# Patient Record
Sex: Female | Born: 1935 | Race: White | Hispanic: No | Marital: Married | Smoking: Never smoker
Health system: Southern US, Community
[De-identification: ages and names within clinical notes are randomized; demographics above are authoritative.]

## PROBLEM LIST (undated history)

## (undated) DIAGNOSIS — I10 Essential (primary) hypertension: Secondary | ICD-10-CM

## (undated) DIAGNOSIS — I4891 Unspecified atrial fibrillation: Secondary | ICD-10-CM

## (undated) DIAGNOSIS — E785 Hyperlipidemia, unspecified: Secondary | ICD-10-CM

## (undated) HISTORY — PX: CARDIAC ELECTROPHYSIOLOGY MAPPING AND ABLATION: SHX1292

## (undated) HISTORY — PX: ABDOMINAL HYSTERECTOMY: SUR658

---

## 2005-01-01 ENCOUNTER — Ambulatory Visit: Payer: Self-pay | Admitting: Internal Medicine

## 2006-01-17 ENCOUNTER — Emergency Department: Payer: Self-pay | Admitting: Emergency Medicine

## 2006-03-30 ENCOUNTER — Ambulatory Visit: Payer: Self-pay | Admitting: Internal Medicine

## 2006-10-28 ENCOUNTER — Ambulatory Visit: Payer: Self-pay | Admitting: Internal Medicine

## 2007-06-14 ENCOUNTER — Ambulatory Visit: Payer: Self-pay | Admitting: Internal Medicine

## 2008-06-14 ENCOUNTER — Ambulatory Visit: Payer: Self-pay | Admitting: Internal Medicine

## 2009-06-17 ENCOUNTER — Ambulatory Visit: Payer: Self-pay | Admitting: Internal Medicine

## 2009-07-23 ENCOUNTER — Ambulatory Visit: Payer: Self-pay | Admitting: Internal Medicine

## 2011-07-17 ENCOUNTER — Ambulatory Visit: Payer: Self-pay | Admitting: Internal Medicine

## 2012-08-15 ENCOUNTER — Ambulatory Visit: Payer: Self-pay | Admitting: Internal Medicine

## 2013-06-18 ENCOUNTER — Emergency Department: Payer: Self-pay | Admitting: Internal Medicine

## 2013-08-29 ENCOUNTER — Ambulatory Visit: Payer: Self-pay | Admitting: Internal Medicine

## 2014-12-21 ENCOUNTER — Other Ambulatory Visit: Payer: Self-pay | Admitting: Internal Medicine

## 2014-12-21 DIAGNOSIS — Z1231 Encounter for screening mammogram for malignant neoplasm of breast: Secondary | ICD-10-CM

## 2014-12-28 ENCOUNTER — Ambulatory Visit: Payer: Self-pay

## 2015-01-03 ENCOUNTER — Ambulatory Visit: Payer: Self-pay | Attending: Internal Medicine

## 2018-07-30 ENCOUNTER — Inpatient Hospital Stay: Payer: Medicare Other

## 2018-07-30 ENCOUNTER — Other Ambulatory Visit: Payer: Self-pay

## 2018-07-30 ENCOUNTER — Inpatient Hospital Stay
Admission: EM | Admit: 2018-07-30 | Discharge: 2018-08-01 | DRG: 186 | Disposition: A | Discharge status: Home / Self Care | Payer: Medicare Other | Attending: Internal Medicine | Admitting: Internal Medicine

## 2018-07-30 ENCOUNTER — Emergency Department: Payer: Medicare Other

## 2018-07-30 ENCOUNTER — Encounter: Payer: Self-pay | Admitting: Emergency Medicine

## 2018-07-30 DIAGNOSIS — Z800 Family history of malignant neoplasm of digestive organs: Secondary | ICD-10-CM

## 2018-07-30 DIAGNOSIS — Z90710 Acquired absence of both cervix and uterus: Secondary | ICD-10-CM | POA: Diagnosis not present

## 2018-07-30 DIAGNOSIS — Z7982 Long term (current) use of aspirin: Secondary | ICD-10-CM | POA: Diagnosis not present

## 2018-07-30 DIAGNOSIS — Z20828 Contact with and (suspected) exposure to other viral communicable diseases: Secondary | ICD-10-CM | POA: Diagnosis present

## 2018-07-30 DIAGNOSIS — F410 Panic disorder [episodic paroxysmal anxiety] without agoraphobia: Secondary | ICD-10-CM | POA: Diagnosis present

## 2018-07-30 DIAGNOSIS — Z79899 Other long term (current) drug therapy: Secondary | ICD-10-CM

## 2018-07-30 DIAGNOSIS — K7460 Unspecified cirrhosis of liver: Secondary | ICD-10-CM

## 2018-07-30 DIAGNOSIS — E663 Overweight: Secondary | ICD-10-CM | POA: Diagnosis present

## 2018-07-30 DIAGNOSIS — I482 Chronic atrial fibrillation, unspecified: Secondary | ICD-10-CM | POA: Diagnosis present

## 2018-07-30 DIAGNOSIS — Z880 Allergy status to penicillin: Secondary | ICD-10-CM | POA: Diagnosis not present

## 2018-07-30 DIAGNOSIS — Z66 Do not resuscitate: Secondary | ICD-10-CM | POA: Diagnosis present

## 2018-07-30 DIAGNOSIS — E785 Hyperlipidemia, unspecified: Secondary | ICD-10-CM | POA: Diagnosis present

## 2018-07-30 DIAGNOSIS — Z882 Allergy status to sulfonamides status: Secondary | ICD-10-CM

## 2018-07-30 DIAGNOSIS — J189 Pneumonia, unspecified organism: Secondary | ICD-10-CM | POA: Diagnosis present

## 2018-07-30 DIAGNOSIS — J90 Pleural effusion, not elsewhere classified: Secondary | ICD-10-CM | POA: Diagnosis present

## 2018-07-30 DIAGNOSIS — I080 Rheumatic disorders of both mitral and aortic valves: Secondary | ICD-10-CM | POA: Diagnosis present

## 2018-07-30 DIAGNOSIS — Z8249 Family history of ischemic heart disease and other diseases of the circulatory system: Secondary | ICD-10-CM | POA: Diagnosis not present

## 2018-07-30 DIAGNOSIS — J9601 Acute respiratory failure with hypoxia: Secondary | ICD-10-CM

## 2018-07-30 DIAGNOSIS — I10 Essential (primary) hypertension: Secondary | ICD-10-CM | POA: Diagnosis present

## 2018-07-30 DIAGNOSIS — R0602 Shortness of breath: Secondary | ICD-10-CM

## 2018-07-30 DIAGNOSIS — Z6827 Body mass index (BMI) 27.0-27.9, adult: Secondary | ICD-10-CM

## 2018-07-30 HISTORY — DX: Essential (primary) hypertension: I10

## 2018-07-30 HISTORY — DX: Unspecified atrial fibrillation: I48.91

## 2018-07-30 HISTORY — DX: Hyperlipidemia, unspecified: E78.5

## 2018-07-30 LAB — BASIC METABOLIC PANEL
Anion gap: 7 (ref 5–15)
BUN: 17 mg/dL (ref 8–23)
CO2: 24 mmol/L (ref 22–32)
Calcium: 8.7 mg/dL — ABNORMAL LOW (ref 8.9–10.3)
Chloride: 105 mmol/L (ref 98–111)
Creatinine, Ser: 0.75 mg/dL (ref 0.44–1.00)
GFR calc Af Amer: >60 mL/min (ref >60)
GFR calc non Af Amer: >60 mL/min (ref >60)
Glucose, Bld: 202 mg/dL — ABNORMAL HIGH (ref 70–99)
Potassium: 4.4 mmol/L (ref 3.5–5.1)
Sodium: 136 mmol/L (ref 135–145)

## 2018-07-30 LAB — SARS CORONAVIRUS 2 BY RT PCR (HOSPITAL ORDER, PERFORMED IN ~~LOC~~ HOSPITAL LAB): SARS Coronavirus 2: NEGATIVE

## 2018-07-30 LAB — CBC
HCT: 41.2 % (ref 36.0–46.0)
Hemoglobin: 13.3 g/dL (ref 12.0–15.0)
MCH: 27.5 pg (ref 26.0–34.0)
MCHC: 32.3 g/dL (ref 30.0–36.0)
MCV: 85.3 fL (ref 80.0–100.0)
Platelets: 263 10*3/uL (ref 150–400)
RBC: 4.83 MIL/uL (ref 3.87–5.11)
RDW: 13.3 % (ref 11.5–15.5)
WBC: 12.6 10*3/uL — ABNORMAL HIGH (ref 4.0–10.5)
nRBC: 0 % (ref 0.0–0.2)

## 2018-07-30 LAB — LACTATE DEHYDROGENASE, PLEURAL OR PERITONEAL FLUID: LD, Fluid: 106 U/L — ABNORMAL HIGH (ref 3–23)

## 2018-07-30 LAB — BODY FLUID CELL COUNT WITH DIFFERENTIAL
Eos, Fluid: 0 %
Lymphs, Fluid: 86 %
Monocyte-Macrophage-Serous Fluid: 8 %
Neutrophil Count, Fluid: 6 %
Other Cells, Fluid: 0 %
Total Nucleated Cell Count, Fluid: 3290 cu mm

## 2018-07-30 LAB — HEMOGLOBIN A1C
Hgb A1c MFr Bld: 6.9 % — ABNORMAL HIGH (ref 4.8–5.6)
Mean Plasma Glucose: 151.33 mg/dL

## 2018-07-30 LAB — PROTEIN, PLEURAL OR PERITONEAL FLUID: Total protein, fluid: 3.4 g/dL

## 2018-07-30 LAB — GLUCOSE, PLEURAL OR PERITONEAL FLUID: Glucose, Fluid: 170 mg/dL

## 2018-07-30 LAB — PROTIME-INR
INR: 1.1 (ref 0.8–1.2)
Prothrombin Time: 14 seconds (ref 11.4–15.2)

## 2018-07-30 LAB — LACTIC ACID, PLASMA: Lactic Acid, Venous: 1 mmol/L (ref 0.5–1.9)

## 2018-07-30 LAB — BRAIN NATRIURETIC PEPTIDE: B Natriuretic Peptide: 167 pg/mL — ABNORMAL HIGH (ref 0.0–100.0)

## 2018-07-30 LAB — TSH: TSH: 2.141 u[IU]/mL (ref 0.350–4.500)

## 2018-07-30 LAB — TROPONIN I: Troponin I: <0.03 ng/mL (ref <0.03)

## 2018-07-30 MED ORDER — IOHEXOL 350 MG/ML SOLN
75.0000 mL | Freq: Once | INTRAVENOUS | Status: AC | PRN
  Administered 2018-07-30: 03:00:00 75 mL via INTRAVENOUS

## 2018-07-30 MED ORDER — METOPROLOL TARTRATE 50 MG PO TABS
50.0000 mg | ORAL_TABLET | Freq: Two times a day (BID) | ORAL | Status: DC
  Administered 2018-07-30 – 2018-08-01 (×5): 50 mg via ORAL
  Filled 2018-07-30 (×5): qty 1

## 2018-07-30 MED ORDER — ACETAMINOPHEN 325 MG PO TABS
650.0000 mg | ORAL_TABLET | Freq: Four times a day (QID) | ORAL | Status: DC | PRN
  Administered 2018-07-31: 06:00:00 650 mg via ORAL
  Filled 2018-07-30: qty 2

## 2018-07-30 MED ORDER — ASPIRIN EC 81 MG PO TBEC
81.0000 mg | DELAYED_RELEASE_TABLET | Freq: Every day | ORAL | Status: DC
  Administered 2018-07-30 – 2018-08-01 (×3): 81 mg via ORAL
  Filled 2018-07-30 (×3): qty 1

## 2018-07-30 MED ORDER — ONDANSETRON HCL 4 MG PO TABS: 4.0000 mg | ORAL_TABLET | Freq: Four times a day (QID) | ORAL | Status: DC | PRN

## 2018-07-30 MED ORDER — ONDANSETRON HCL 4 MG/2ML IJ SOLN: 4.0000 mg | Freq: Four times a day (QID) | INTRAMUSCULAR | Status: DC | PRN

## 2018-07-30 MED ORDER — LEVOFLOXACIN IN D5W 750 MG/150ML IV SOLN
750.0000 mg | Freq: Once | INTRAVENOUS | Status: AC
  Administered 2018-07-30: 02:00:00 750 mg via INTRAVENOUS
  Filled 2018-07-30: qty 150

## 2018-07-30 MED ORDER — DOCUSATE SODIUM 100 MG PO CAPS
100.0000 mg | ORAL_CAPSULE | Freq: Two times a day (BID) | ORAL | Status: DC
  Administered 2018-07-30 – 2018-08-01 (×5): 100 mg via ORAL
  Filled 2018-07-30 (×5): qty 1

## 2018-07-30 MED ORDER — SODIUM CHLORIDE 0.9 % IV SOLN
INTRAVENOUS | Status: DC
  Administered 2018-07-30: 10:00:00 via INTRAVENOUS

## 2018-07-30 MED ORDER — LORAZEPAM BOLUS VIA INFUSION: 0.5000 mg | Freq: Once | INTRAVENOUS | Status: DC

## 2018-07-30 MED ORDER — ADULT MULTIVITAMIN W/MINERALS CH
1.0000 | ORAL_TABLET | Freq: Every day | ORAL | Status: DC
  Administered 2018-07-30 – 2018-08-01 (×3): 1 via ORAL
  Filled 2018-07-30 (×3): qty 1

## 2018-07-30 MED ORDER — LORAZEPAM 2 MG/ML IJ SOLN
0.5000 mg | Freq: Once | INTRAMUSCULAR | Status: AC
  Administered 2018-07-30: 0.5 mg via INTRAVENOUS
  Filled 2018-07-30: qty 1

## 2018-07-30 MED ORDER — SODIUM CHLORIDE 0.9 % IV SOLN
500.0000 mg | INTRAVENOUS | Status: DC
  Filled 2018-07-30: qty 500

## 2018-07-30 MED ORDER — ENOXAPARIN SODIUM 40 MG/0.4ML ~~LOC~~ SOLN
40.0000 mg | SUBCUTANEOUS | Status: DC
  Administered 2018-07-30: 10:00:00 40 mg via SUBCUTANEOUS
  Filled 2018-07-30: qty 0.4

## 2018-07-30 MED ORDER — SODIUM CHLORIDE 0.9 % IV SOLN: 2.0000 g | INTRAVENOUS | Status: DC

## 2018-07-30 MED ORDER — SODIUM CHLORIDE 0.9 % IV SOLN
500.0000 mg | INTRAVENOUS | Status: DC
  Administered 2018-07-31 – 2018-08-01 (×2): 500 mg via INTRAVENOUS
  Filled 2018-07-30 (×3): qty 500

## 2018-07-30 MED ORDER — SODIUM CHLORIDE 0.9 % IV SOLN
1.0000 g | INTRAVENOUS | Status: DC
  Administered 2018-07-30 – 2018-08-01 (×3): 1 g via INTRAVENOUS
  Filled 2018-07-30 (×2): qty 1
  Filled 2018-07-30: qty 10
  Filled 2018-07-30: qty 1

## 2018-07-30 MED ORDER — PRAVASTATIN SODIUM 20 MG PO TABS
20.0000 mg | ORAL_TABLET | Freq: Every day | ORAL | Status: DC
  Administered 2018-07-30 – 2018-07-31 (×2): 20 mg via ORAL
  Filled 2018-07-30 (×2): qty 1

## 2018-07-30 MED ORDER — ACETAMINOPHEN 650 MG RE SUPP: 650.0000 mg | Freq: Four times a day (QID) | RECTAL | Status: DC | PRN

## 2018-07-30 NOTE — Procedures (Addendum)
PROCEDURE NOTE: THORACENTESIS    Date: 07/30/2018,   Loc:215A/215A-AA MRN# [id number] Lisa Moon   Indication: Pleural effusion/diagnsyic/therapeutic/ Empyema  A time-out was completed verifying correct patient, procedure, site, positioning, and implant(s) or special equipment if applicable.  Ultrasound guidance was used and appropriate fluid pocket was identified and marked.   Patient was positioned, prepped and draped in usual sterile fashion.  <_1____>% Lidocaine was used to anesthetize the area.  A 7 French catheter was introduced into the pleural space and fluid was removed. Blood loss was <none>  A chest xray was ordered to evaluate for pneumothorax.  Total Fluid Removed: <_1251ml___>  Color of Fluid: <_yellow straw colored ___>  Sent for: Cell Count Gram Stain Cultures LDH Glucose pH Cytology  Patient tolerated the procedure <well> and there were <no> complications.     Vida Rigger, M.D.  Pulmonary & Critical Care Medicine  [facility]

## 2018-07-30 NOTE — ED Notes (Addendum)
ED TO INPATIENT HANDOFF REPORT  ED Nurse Name and Phone #:  Darcey Nora RN [phone]  S Name/Age/Gender Lisa Moon 85 y.o. female Room/Bed: ED04A/ED04A  Code Status   Code Status: Not on file  Home/SNF/Other Home Patient oriented to: self, place, time and situation Is this baseline? Yes   Triage Complete: Triage complete  Chief Complaint Difficulty breathing  Triage Note Pt arrived via EMS from home with c/o increased chest pain and SOB x3 days, with tonight becoming unbearable while sleeping. Pt sts, "I just couldn't breath." Pt received 4 Asprin, 2 sublingual Nitro and 4mg  zofran in route, per EMS. Pt in Afib, with known hx. Pt 94% on RA on arrival.     Allergies Allergies  Allergen Reactions  . Sulfa Antibiotics   . Penicillins Rash    Level of Care/Admitting Diagnosis ED Disposition    ED Disposition Condition Comment   Admit  Hospital Area: [facility] [[id number]]  Level of Care: Med-Surg [16]  Covid Evaluation: Screening Protocol (No Symptoms)  Diagnosis: CAP (community acquired pneumonia) [919166]  Admitting Physician: Arnaldo Natal [[id number]]  Attending Physician: Arnaldo Natal [[id number]]  Estimated length of stay: past midnight tomorrow  Certification:: I certify this patient will need inpatient services for at least 2 midnights  PT Class (Do Not Modify): Inpatient [101]  PT Acc Code (Do Not Modify): Private [1]       B Medical/Surgery History Past Medical History:  Diagnosis Date  . A-fib (HCC)      A IV Location/Drains/Wounds Patient Lines/Drains/Airways Status   Active Line/Drains/Airways    Name:   Placement date:   Placement time:   Site:   Days:   Peripheral IV 07/30/18 Left Forearm   07/30/18    0040    Forearm   less than 1   Peripheral IV 07/30/18 Left Hand   07/30/18    0030    Hand   less than 1          Intake/Output Last 24 hours  Intake/Output Summary (Last 24 hours) at 07/30/2018 3953 Last  data filed at 07/30/2018 0411 Gross per 24 hour  Intake 150 ml  Output -  Net 150 ml    Labs/Imaging Results for orders placed or performed during the hospital encounter of 07/30/18 (from the past 48 hour(s))  Basic metabolic panel     Status: Abnormal   Collection Time: 07/30/18 12:38 AM  Result Value Ref Range   Sodium 136 135 - 145 mmol/L   Potassium 4.4 3.5 - 5.1 mmol/L   Chloride 105 98 - 111 mmol/L   CO2 24 22 - 32 mmol/L   Glucose, Bld 202 (H) 70 - 99 mg/dL   BUN 17 8 - 23 mg/dL   Creatinine, Ser 2.02 0.44 - 1.00 mg/dL   Calcium 8.7 (L) 8.9 - 10.3 mg/dL   GFR calc non Af Amer >60 >60 mL/min   GFR calc Af Amer >60 >60 mL/min   Anion gap 7 5 - 15    Comment: Performed at [facility], [address] [place], [place] [address]  CBC     Status: Abnormal   Collection Time: 07/30/18 12:38 AM  Result Value Ref Range   WBC 12.6 (H) 4.0 - 10.5 K/uL   RBC 4.83 3.87 - 5.11 MIL/uL   Hemoglobin 13.3 12.0 - 15.0 g/dL   HCT 68.6 16.8 - 37.2 %   MCV 85.3 80.0 - 100.0 fL   MCH 27.5 26.0 -  34.0 pg   MCHC 32.3 30.0 - 36.0 g/dL   RDW 16.113.3 09.611.5 - 04.515.5 %   Platelets 263 150 - 400 K/uL   nRBC 0.0 0.0 - 0.2 %    Comment: Performed at [facility], [address] [place], [place] [address]  Troponin I - ONCE - STAT     Status: None   Collection Time: 07/30/18 12:38 AM  Result Value Ref Range   Troponin I <0.03 <0.03 ng/mL    Comment: Performed at [facility], [address] [place], [place] [address]  Brain natriuretic peptide     Status: Abnormal   Collection Time: 07/30/18 12:38 AM  Result Value Ref Range   B Natriuretic Peptide 167.0 (H) 0.0 - 100.0 pg/mL    Comment: Performed at [facility], [address] [place], [place] [address]  SARS Coronavirus 2 (CEPHEID- Performed in [facility] hospital lab), Hosp Order     Status: None   Collection Time: 07/30/18  1:33 AM  Result Value Ref Range   SARS Coronavirus 2 NEGATIVE NEGATIVE     Comment: (NOTE) If result is NEGATIVE SARS-CoV-2 target nucleic acids are NOT DETECTED. The SARS-CoV-2 RNA is generally detectable in upper and lower  respiratory specimens during the acute phase of infection. The lowest  concentration of SARS-CoV-2 viral copies this assay can detect is 250  copies / mL. A negative result does not preclude SARS-CoV-2 infection  and should not be used as the sole basis for treatment or other  patient management decisions.  A negative result may occur with  improper specimen collection / handling, submission of specimen other  than nasopharyngeal swab, presence of viral mutation(s) within the  areas targeted by this assay, and inadequate number of viral copies  (<250 copies / mL). A negative result must be combined with clinical  observations, patient history, and epidemiological information. If result is POSITIVE SARS-CoV-2 target nucleic acids are DETECTED. The SARS-CoV-2 RNA is generally detectable in upper and lower  respiratory specimens dur ing the acute phase of infection.  Positive  results are indicative of active infection with SARS-CoV-2.  Clinical  correlation with patient history and other diagnostic information is  necessary to determine patient infection status.  Positive results do  not rule out bacterial infection or co-infection with other viruses. If result is PRESUMPTIVE POSTIVE SARS-CoV-2 nucleic acids MAY BE PRESENT.   A presumptive positive result was obtained on the submitted specimen  and confirmed on repeat testing.  While 2019 novel coronavirus  (SARS-CoV-2) nucleic acids may be present in the submitted sample  additional confirmatory testing may be necessary for epidemiological  and / or clinical management purposes  to differentiate between  SARS-CoV-2 and other Sarbecovirus currently known to infect humans.  If clinically indicated additional testing with an alternate test  methodology [id number]) is advised. The SARS-CoV-2  RNA is generally  detectable in upper and lower respiratory sp ecimens during the acute  phase of infection. The expected result is Negative. Fact Sheet for Patients:  BoilerBrush.com.cyhttps://www.fda.gov/media/136312/download Fact Sheet for Healthcare Providers: https://pope.com/https://www.fda.gov/media/136313/download This test is not yet approved or cleared by the [place] FDA and has been authorized for detection and/or diagnosis of SARS-CoV-2 by FDA under an Emergency Use Authorization (EUA).  This EUA will remain in effect (meaning this test can be used) for the duration of the COVID-19 declaration under Section 564(b)(1) of the Act, 21 U.S.C. section 360bbb-3(b)(1), unless the authorization is terminated or revoked sooner. Performed at  [facility], [address], [place], [place] [address]   Lactic acid, plasma     Status: None   Collection Time: 07/30/18  2:13 AM  Result Value Ref Range   Lactic Acid, Venous 1.0 0.5 - 1.9 mmol/L    Comment: Performed at [facility], [address] [place], [place] [address]   Ct Angio Chest Pe W And/or Wo Contrast  Result Date: 07/30/2018 CLINICAL DATA:  Chest pain, shortness of breath x3 days EXAM: CT ANGIOGRAPHY CHEST WITH CONTRAST TECHNIQUE: Multidetector CT imaging of the chest was performed using the standard protocol during bolus administration of intravenous contrast. Multiplanar CT image reconstructions and MIPs were obtained to evaluate the vascular anatomy. CONTRAST:  75mL OMNIPAQUE IOHEXOL 350 MG/ML SOLN COMPARISON:  Chest radiograph dated 07/30/2018 FINDINGS: Cardiovascular: Satisfactory opacification of the bilateral pulmonary arteries to the segmental level. No evidence of pulmonary embolism. Heart is normal in size.  No pericardial effusion. Mild atherosclerotic calcifications of the aortic arch. Enlargement of the main pulmonary artery, suggesting pulmonary arterial hypertension. Three vessel coronary atherosclerosis. Mediastinum/Nodes:  Suspected 9 mm short axis subcarinal node (series 5/image 39), within normal limits. No suspicious mediastinal lymphadenopathy. Visualized thyroid is unremarkable. Lungs/Pleura: Large right pleural effusion. Associated right upper, middle, and lower lobe compressive atelectasis. Mild interlobular septal thickening in the right upper lobe may reflect asymmetric edema (series 7/image 24), possibly related to lung re-expansion in the setting of the effusion. Small left pleural effusion.  Mild left basilar atelectasis. No suspicious pulmonary nodules, noting poor visualization of the right lung. No pneumothorax. Upper Abdomen: Nodular hepatic contour, raising the possibility of cirrhosis. Calcified hepatic and splenic granulomata. Vascular calcifications. Musculoskeletal: Mild degenerative changes of the visualized thoracolumbar spine. Review of the MIP images confirms the above findings. IMPRESSION: No evidence of pulmonary embolism. Enlargement of the main pulmonary artery, suggesting pulmonary arterial hypertension. Large right pleural effusion with associated right lung atelectasis. Small left pleural effusion. Nodular hepatic contour, suggesting cirrhosis. Aortic Atherosclerosis (ICD10-I70.0). Electronically Signed   By: Charline Bills M.D.   On: 07/30/2018 04:06   Dg Chest Portable 1 View  Result Date: 07/30/2018 CLINICAL DATA:  Chest pain EXAM: PORTABLE CHEST 1 VIEW COMPARISON:  None. FINDINGS: Right greater than left pleural effusions. Obscured cardiomediastinal silhouette. Vascular congestion. Diffuse opacity in the right thorax, likely combination of layering effusion and underlying edema. Focal consolidations in the right upper lobe and bilateral lung bases. Aortic atherosclerosis. No pneumothorax. IMPRESSION: 1. Cardiomegaly with vascular congestion and mild interstitial edema. 2. Right greater than left pleural effusions. Diffuse hazy asymmetric opacity in the right thorax may be due to combination  of layering effusion and underlying edema 3. Multifocal consolidations in the right upper lobe and bilateral lung bases may reflect pneumonia Electronically Signed   By: Jasmine Pang M.D.   On: 07/30/2018 01:39    Pending Labs Unresulted Labs (From admission, onward)    Start     Ordered   07/30/18 0145  Lactic acid, plasma  Now then every 2 hours,   STAT     07/30/18 0145   07/30/18 0145  Blood Culture (routine x 2)  BLOOD CULTURE X 2,   STAT     07/30/18 0145   Signed and Held  Creatinine, serum  (enoxaparin (LOVENOX)    CrCl >/= 30 ml/min)  Weekly,   R    Comments:  while on enoxaparin therapy    Signed and Held   Signed and Held  TSH  Add-on,  R     Signed and Held   Signed and Held  Hemoglobin A1c  Add-on,   R     Signed and Held          Vitals/Pain Today's Vitals   07/30/18 0032 07/30/18 0300 07/30/18 0457 07/30/18 0753  BP:  (!) 166/89  (!) 147/92  Pulse: 100 (!) 110  (!) 104  Resp: (!) 24 (!) 29  (!) 24  Temp: 98.6 F (37 C)     TempSrc: Oral     SpO2: 92% 98%  97%  Weight:   77.1 kg   Height:    (1.676 m)     Isolation Precautions Droplet and Contact precautions  Medications Medications  levofloxacin (LEVAQUIN) IVPB 750 mg (0 mg Intravenous Stopped 07/30/18 0411)  iohexol (OMNIPAQUE) 350 MG/ML injection 75 mL (75 mLs Intravenous Contrast Given 07/30/18 0231)    Mobility walks with person assist Low fall risk   Focused Assessments Pulmonary Assessment Handoff:  Lung sounds: L Breath Sounds: Diminished R Breath Sounds: Diminished O2 Device: Nasal Cannula O2 Flow Rate (L/min): 2 L/min      R Recommendations: See Admitting Provider Note  Report given to:   Additional Notes:  Pt currently on 2L [place], does not wear oxygen at home.

## 2018-07-30 NOTE — Progress Notes (Signed)
MD notified: The respiratory supervisors number is [phone] and her name is Glendale Chard in case you can talk to her about the sample needed for the [facility]

## 2018-07-30 NOTE — Progress Notes (Signed)
CODE SEPSIS - PHARMACY COMMUNICATION  **Broad Spectrum Antibiotics should be administered within 1 hour of Sepsis diagnosis**  Time Code Sepsis Called/Page Received: @ 0145  Antibiotics Ordered: levofloxacin   Time of 1st antibiotic administration: @ 0221  Gardner Candle, PharmD, BCPS Clinical Pharmacist 07/30/2018 2:26 AM

## 2018-07-30 NOTE — Progress Notes (Signed)
Advanced care plan.  Purpose of the Encounter: CODE STATUS  Parties in Attendance: Patient herself  Patient's Decision Capacity: Intact  Subjective/Patient's story: Patient is 85 year old admitted with shortness of breath noted to have large right-sided pleural effusion, she has a history of atrial fibrillation and hyperlipidemia.    Objective/Medical story I discussed with the patient regarding her desires for cardiac and pulmonary resuscitation.  Also discussed regarding her living will and healthcare power of attorney.   Goals of care determination:   Patient states that she wants to be a DNR  CODE STATUS: DNR   Time spent discussing advanced care planning: 16 minutes

## 2018-07-30 NOTE — Progress Notes (Signed)
[employer] - [place] at [facility]                                                                                                                                                                                  Patient Demographics   Lisa Moon, is a 85 y.o. female, DOB - 1932-06-06, ZOX:[id number]  Admit date - 07/30/2018   Admitting Physician Arnaldo Natal, MD  Outpatient Primary MD for the patient is Patient, No Pcp Per   LOS - 0  Subjective: Coming in with shortness of breath, noted to have large right-sided pleural effusion Patient states that she has had shortness of breath progressive over the past few months    Review of Systems:   CONSTITUTIONAL: No documented fever. No fatigue, weakness. No weight gain, no weight loss.  EYES: No blurry or double vision.  ENT: No tinnitus. No postnasal drip. No redness of the oropharynx.  RESPIRATORY: No cough, no wheeze, no hemoptysis.  Positive dyspnea.  CARDIOVASCULAR: No chest pain. No orthopnea. No palpitations. No syncope.  GASTROINTESTINAL: No nausea, no vomiting or diarrhea. No abdominal pain. No melena or hematochezia.  GENITOURINARY: No dysuria or hematuria.  ENDOCRINE: No polyuria or nocturia. No heat or cold intolerance.  HEMATOLOGY: No anemia. No bruising. No bleeding.  INTEGUMENTARY: No rashes. No lesions.  MUSCULOSKELETAL: No arthritis. No swelling. No gout.  NEUROLOGIC: No numbness, tingling, or ataxia. No seizure-type activity.  PSYCHIATRIC: No anxiety. No insomnia. No ADD.    Vitals:   Vitals:   07/30/18 0800 07/30/18 0830 07/30/18 0857 07/30/18 1200  BP: (!) 150/90 (!) 148/77 127/89 130/76  Pulse: (!) 101 90 (!) 102 86  Resp: (!) Temp:   98.4 F (36.9 C) (!) 96.6 F (35.9 C)  TempSrc:   Oral Axillary  SpO2: 99% 99% 97% 98%  Weight:      Height:        Wt Readings from Last 3 Encounters:  07/30/18 77.1 kg     Intake/Output Summary (Last 24 hours) at 07/30/2018  1355 Last data filed at 07/30/2018 1332 Gross per 24 hour  Intake 489.34 ml  Output 400 ml  Net 89.34 ml    Physical Exam:   GENERAL: Pleasant-appearing in no apparent distress.  HEAD, EYES, EARS, NOSE AND THROAT: Atraumatic, normocephalic. Extraocular muscles are intact. Pupils equal and reactive to light. Sclerae anicteric. No conjunctival injection. No oro-pharyngeal erythema.  NECK: Supple. There is no jugular venous distention. No bruits, no lymphadenopathy, no thyromegaly.  HEART: Regular rate and rhythm,. No murmurs, no rubs, no clicks.  LUNGS: Diminished breath sounds in the right lung ABDOMEN: Soft, flat, nontender, nondistended.  Has good bowel sounds. No hepatosplenomegaly appreciated.  EXTREMITIES: No evidence of any cyanosis, clubbing, or peripheral edema.  +2 pedal and radial pulses bilaterally.  NEUROLOGIC: The patient is alert, awake, and oriented x3 with no focal motor or sensory deficits appreciated bilaterally.  SKIN: Moist and warm with no rashes appreciated.  Psych: Not anxious, depressed LN: No inguinal LN enlargement    Antibiotics   Anti-infectives (From admission, onward)   Start     Dose/Rate Route Frequency Ordered Stop   07/30/18 1000  cefTRIAXone (ROCEPHIN) 1 g in sodium chloride 0.9 % 100 mL IVPB     1 g 200 mL/hr over 30 Minutes Intravenous Every 24 hours 07/30/18 0848     07/30/18 0215  levofloxacin (LEVAQUIN) IVPB 750 mg     750 mg 100 mL/hr over 90 Minutes Intravenous  Once 07/30/18 0210 07/30/18 0411   07/30/18 0200  cefTRIAXone (ROCEPHIN) 2 g in sodium chloride 0.9 % 100 mL IVPB  Status:  Discontinued     2 g 200 mL/hr over 30 Minutes Intravenous Every 24 hours 07/30/18 0145 07/30/18 0207   07/30/18 0200  azithromycin (ZITHROMAX) 500 mg in sodium chloride 0.9 % 250 mL IVPB  Status:  Discontinued     500 mg 250 mL/hr over 60 Minutes Intravenous Every 24 hours 07/30/18 0145 07/30/18 0210      Medications   Scheduled Meds: . aspirin EC  81  mg Oral Daily  . docusate sodium  100 mg Oral BID  . enoxaparin (LOVENOX) injection  40 mg Subcutaneous Q24H  . LORazepam  0.5 mg Intravenous Once  . metoprolol tartrate  50 mg Oral BID  . multivitamin with minerals  1 tablet Oral Daily  . pravastatin  20 mg Oral q1800   Continuous Infusions: . cefTRIAXone (ROCEPHIN)  IV Stopped (07/30/18 1017)   PRN Meds:.acetaminophen **OR** acetaminophen, ondansetron **OR** ondansetron (ZOFRAN) IV   Data Review:   Micro Results Recent Results (from the past 240 hour(s))  SARS Coronavirus 2 (CEPHEID- Performed in [facility] hospital lab), Hosp Order     Status: None   Collection Time: 07/30/18  1:33 AM  Result Value Ref Range Status   SARS Coronavirus 2 NEGATIVE NEGATIVE Final    Comment: (NOTE) If result is NEGATIVE SARS-CoV-2 target nucleic acids are NOT DETECTED. The SARS-CoV-2 RNA is generally detectable in upper and lower  respiratory specimens during the acute phase of infection. The lowest  concentration of SARS-CoV-2 viral copies this assay can detect is 250  copies / mL. A negative result does not preclude SARS-CoV-2 infection  and should not be used as the sole basis for treatment or other  patient management decisions.  A negative result may occur with  improper specimen collection / handling, submission of specimen other  than nasopharyngeal swab, presence of viral mutation(s) within the  areas targeted by this assay, and inadequate number of viral copies  (<250 copies / mL). A negative result must be combined with clinical  observations, patient history, and epidemiological information. If result is POSITIVE SARS-CoV-2 target nucleic acids are DETECTED. The SARS-CoV-2 RNA is generally detectable in upper and lower  respiratory specimens dur ing the acute phase of infection.  Positive  results are indicative of active infection with SARS-CoV-2.  Clinical  correlation with patient history and other diagnostic information is   necessary to determine patient infection status.  Positive results do  not rule out bacterial infection or co-infection with other viruses. If result is PRESUMPTIVE POSTIVE  SARS-CoV-2 nucleic acids MAY BE PRESENT.   A presumptive positive result was obtained on the submitted specimen  and confirmed on repeat testing.  While 2019 novel coronavirus  (SARS-CoV-2) nucleic acids may be present in the submitted sample  additional confirmatory testing may be necessary for epidemiological  and / or clinical management purposes  to differentiate between  SARS-CoV-2 and other Sarbecovirus currently known to infect humans.  If clinically indicated additional testing with an alternate test  methodology [phone]) is advised. The SARS-CoV-2 RNA is generally  detectable in upper and lower respiratory sp ecimens during the acute  phase of infection. The expected result is Negative. Fact Sheet for Patients:  BoilerBrush.com.cy Fact Sheet for Healthcare Providers: https://pope.com/ This test is not yet approved or cleared by the [place] FDA and has been authorized for detection and/or diagnosis of SARS-CoV-2 by FDA under an Emergency Use Authorization (EUA).  This EUA will remain in effect (meaning this test can be used) for the duration of the COVID-19 declaration under Section 564(b)(1) of the Act, 21 U.S.C. section 360bbb-3(b)(1), unless the authorization is terminated or revoked sooner. Performed at [facility], [address] [place], [place] [address]   Blood Culture (routine x 2)     Status: None (Preliminary result)   Collection Time: 07/30/18  2:13 AM  Result Value Ref Range Status   Specimen Description BLOOD LEFT HAND  Final   Special Requests   Final    BOTTLES DRAWN AEROBIC AND ANAEROBIC Blood Culture adequate volume   Culture   Final    NO GROWTH < 12 HOURS Performed at [facility], [address]  [place], [place] [address]    Report Status PENDING  Incomplete  Blood Culture (routine x 2)     Status: None (Preliminary result)   Collection Time: 07/30/18  2:13 AM  Result Value Ref Range Status   Specimen Description BLOOD LEFT ANTECUBITAL  Final   Special Requests   Final    BOTTLES DRAWN AEROBIC AND ANAEROBIC Blood Culture results may not be optimal due to an excessive volume of blood received in culture bottles   Culture   Final    NO GROWTH < 12 HOURS Performed at [facility], [address], [place], [place] [address]    Report Status PENDING  Incomplete    Radiology Reports Ct Angio Chest Pe W And/or Wo Contrast  Result Date: 07/30/2018 CLINICAL DATA:  Chest pain, shortness of breath x3 days EXAM: CT ANGIOGRAPHY CHEST WITH CONTRAST TECHNIQUE: Multidetector CT imaging of the chest was performed using the standard protocol during bolus administration of intravenous contrast. Multiplanar CT image reconstructions and MIPs were obtained to evaluate the vascular anatomy. CONTRAST:  75mL OMNIPAQUE IOHEXOL 350 MG/ML SOLN COMPARISON:  Chest radiograph dated 07/30/2018 FINDINGS: Cardiovascular: Satisfactory opacification of the bilateral pulmonary arteries to the segmental level. No evidence of pulmonary embolism. Heart is normal in size.  No pericardial effusion. Mild atherosclerotic calcifications of the aortic arch. Enlargement of the main pulmonary artery, suggesting pulmonary arterial hypertension. Three vessel coronary atherosclerosis. Mediastinum/Nodes: Suspected 9 mm short axis subcarinal node (series 5/image 39), within normal limits. No suspicious mediastinal lymphadenopathy. Visualized thyroid is unremarkable. Lungs/Pleura: Large right pleural effusion. Associated right upper, middle, and lower lobe compressive atelectasis. Mild interlobular septal thickening in the right upper lobe may reflect asymmetric edema (series 7/image 24), possibly related to lung re-expansion in the  setting of the effusion. Small left pleural effusion.  Mild left basilar atelectasis.  No suspicious pulmonary nodules, noting poor visualization of the right lung. No pneumothorax. Upper Abdomen: Nodular hepatic contour, raising the possibility of cirrhosis. Calcified hepatic and splenic granulomata. Vascular calcifications. Musculoskeletal: Mild degenerative changes of the visualized thoracolumbar spine. Review of the MIP images confirms the above findings. IMPRESSION: No evidence of pulmonary embolism. Enlargement of the main pulmonary artery, suggesting pulmonary arterial hypertension. Large right pleural effusion with associated right lung atelectasis. Small left pleural effusion. Nodular hepatic contour, suggesting cirrhosis. Aortic Atherosclerosis (ICD10-I70.0). Electronically Signed   By: Charline Bills M.D.   On: 07/30/2018 04:06   Dg Chest Portable 1 View  Result Date: 07/30/2018 CLINICAL DATA:  Chest pain EXAM: PORTABLE CHEST 1 VIEW COMPARISON:  None. FINDINGS: Right greater than left pleural effusions. Obscured cardiomediastinal silhouette. Vascular congestion. Diffuse opacity in the right thorax, likely combination of layering effusion and underlying edema. Focal consolidations in the right upper lobe and bilateral lung bases. Aortic atherosclerosis. No pneumothorax. IMPRESSION: 1. Cardiomegaly with vascular congestion and mild interstitial edema. 2. Right greater than left pleural effusions. Diffuse hazy asymmetric opacity in the right thorax may be due to combination of layering effusion and underlying edema 3. Multifocal consolidations in the right upper lobe and bilateral lung bases may reflect pneumonia Electronically Signed   By: Jasmine Pang M.D.   On: 07/30/2018 01:39     CBC Recent Labs  Lab 07/30/18 0038  WBC 12.6*  HGB 13.3  HCT 41.2  PLT 263  MCV 85.3  MCH 27.5  MCHC 32.3  RDW 13.3    Chemistries  Recent Labs  Lab 07/30/18 0038  NA 136  K 4.4  CL 105  CO2 24   GLUCOSE 202*  BUN 17  CREATININE 0.75  CALCIUM 8.7*   ------------------------------------------------------------------------------------------------------------------ estimated creatinine clearance is 55.9 mL/min (by C-G formula based on SCr of 0.75 mg/dL). ------------------------------------------------------------------------------------------------------------------ No results for input(s): HGBA1C in the last 72 hours. ------------------------------------------------------------------------------------------------------------------ No results for input(s): CHOL, HDL, LDLCALC, TRIG, CHOLHDL, LDLDIRECT in the last 72 hours. ------------------------------------------------------------------------------------------------------------------ Recent Labs    07/30/18 0956  TSH 2.141   ------------------------------------------------------------------------------------------------------------------ No results for input(s): VITAMINB12, FOLATE, FERRITIN, TIBC, IRON, RETICCTPCT in the last 72 hours.  Coagulation profile Recent Labs  Lab 07/30/18 1313  INR 1.1    No results for input(s): DDIMER in the last 72 hours.  Cardiac Enzymes Recent Labs  Lab 07/30/18 0038  TROPONINI <0.03   ------------------------------------------------------------------------------------------------------------------ Invalid input(s): POCBNP    Assessment & Plan  Patient is 85 year old female admitted with shortness of breath  1.  Acute respiratory failure I suspect this is due to pleural effusion and less likely community-acquired pneumonia I have discussed with the pulmonologist will see the patient and likely tap the fluid  2.  Sepsis being ruled out  3.  Atrial fibrillation continue metoprolol and aspirin  4.  Hyperlipidemia resume home cholesterol medication         Code Status Orders  (From admission, onward)         Start     Ordered   07/30/18 1354  Do not attempt  resuscitation (DNR)  Continuous    Question Answer Comment  In the event of cardiac or respiratory ARREST Do not call a "code blue"   In the event of cardiac or respiratory ARREST Do not perform Intubation, CPR, defibrillation or ACLS   In the event of cardiac or respiratory ARREST Use medication by any route, position, wound care, and other measures to relive pain and suffering. May use  oxygen, suction and manual treatment of airway obstruction as needed for comfort.      07/30/18 1354        Code Status History    Date Active Date Inactive Code Status Order ID Comments User Context   07/30/2018 0848 07/30/2018 1354 Full Code [id number]  Arnaldo Nataliamond, Michael S, MD Inpatient    Advance Directive Documentation     Most Recent Value  Type of Advance Directive  Living will  Pre-existing out of facility DNR order (yellow form or pink MOST form)  -  "MOST" Form in Place?  -           Consults pulmonary critical care  DVT Prophylaxis  Lovenox  Lab Results  Component Value Date   PLT 263 07/30/2018     Time Spent in minutes   45 minutes spent from 12:45 PM to 1:30 PM greater than 50% of time spent in care coordination and counseling patient regarding the condition and plan of care.   Auburn BilberryShreyang Shirleen Mcfaul M.D on 07/30/2018 at 1:55 PM  Between 7am to 6pm - Pager - [phone]  After 6pm go to [email or web address] - Social research officer, governmentpassword [email or web address]  [employer]   Office  [phone]

## 2018-07-30 NOTE — Plan of Care (Addendum)
On 2L of oxygen via nasal cannula for comfort. Pulmonologist consulted. 1.2L of fluid removed from plural effusion. Thoracenteses performed .  The patient indicates she feels better now. Fluid samples obtained and sent down to lab.   The patient's daughter was called and the patient spoke with her daughter after the thoracentesis was performed. Plan of care was reviewed with the patient's family member.   Goal: Knowledge of General Education information will improve Description Including pain rating scale, medication(s)/side effects and non-pharmacologic comfort measures Outcome: Progressing   Problem: Health Behavior/Discharge Planning: Goal: Ability to manage health-related needs will improve Outcome: Progressing   Problem: Clinical Measurements: Goal: Ability to maintain clinical measurements within normal limits will improve Outcome: Progressing Goal: Will remain free from infection Outcome: Progressing Goal: Diagnostic test results will improve Outcome: Progressing Goal: Respiratory complications will improve Outcome: Progressing Goal: Cardiovascular complication will be avoided Outcome: Progressing   Problem: Activity: Goal: Risk for activity intolerance will decrease Outcome: Progressing   Problem: Nutrition: Goal: Adequate nutrition will be maintained Outcome: Progressing   Problem: Coping: Goal: Level of anxiety will decrease Outcome: Progressing   Problem: Elimination: Goal: Will not experience complications related to bowel motility Outcome: Progressing Goal: Will not experience complications related to urinary retention Outcome: Progressing   Problem: Pain Managment: Goal: General experience of comfort will improve Outcome: Progressing   Problem: Safety: Goal: Ability to remain free from injury will improve Outcome: Progressing   Problem: Skin Integrity: Goal: Risk for impaired skin integrity will decrease Outcome: Progressing

## 2018-07-30 NOTE — Progress Notes (Signed)
Verbal order provided by Dr. Allena Katz to stop IV fluids. Order has been stopped by nurse.

## 2018-07-30 NOTE — ED Triage Notes (Signed)
Pt arrived via EMS from home with c/o increased chest pain and SOB x3 days, with tonight becoming unbearable while sleeping. Pt sts, "I just couldn't breath." Pt received 4 Asprin, 2 sublingual Nitro and 4mg  zofran in route, per EMS. Pt in Afib, with known hx. Pt 94% on RA on arrival.

## 2018-07-30 NOTE — Progress Notes (Signed)
MD notified: The patient takes lovastatin at home and wants to know if she will take this as well while in the hospital as it is not ordered.

## 2018-07-30 NOTE — H&P (Signed)
Lisa Moon is an 85 y.o. female.   Chief Complaint: Shortness of breath HPI: The patient with past medical history of atrial fibrillation and hypertension presents to the emergency department complaining of shortness of breath.  The patient states that she has become more frequently dyspneic over the course of the last 2 months.  She states that her symptoms began with a cold.  She denies travel risk factors or known contact with individuals with exposure to novel coronavirus.  COVID-19 testing was negative in the emergency department.  However chest x-ray showed multifocal consolidations in the right upper lobe and bilateral bases.  The patient met criteria for sepsis which prompted blood cultures and broad-spectrum antibiotics.  The patient remained hemodynamically stable throughout her emergency department evaluation.  The hospitalist service was then called for further management.  Past Medical History:  Diagnosis Date  . A-fib ([place])   . HLD (hyperlipidemia)   . Hypertension     Past Surgical History:  Procedure Laterality Date  . ABDOMINAL HYSTERECTOMY    . CARDIAC ELECTROPHYSIOLOGY MAPPING AND ABLATION      Family History  Problem Relation Age of Onset  . Liver cancer Mother   . Heart attack Mother   . CAD Father    Social History:  has no history on file for tobacco, alcohol, and drug.  Allergies:  Allergies  Allergen Reactions  . Sulfa Antibiotics   . Penicillins Rash    Prior to Admission medications   Medication Sig Start Date End Date Taking? Authorizing Provider  aspirin EC 81 MG tablet Take 81 mg by mouth daily.    [provider]  Calcium Carbonate-Vitamin D (CALTRATE 600+D PO) Take 1 tablet by mouth 2 (two) times daily with a meal.    [provider]  lovastatin (MEVACOR) 40 MG tablet Take 40 mg by mouth at bedtime. 05/25/18   [provider]  metoprolol tartrate (LOPRESSOR) 50 MG tablet Take 50 mg by mouth 2 (two) times daily. 06/15/18    [provider]  Multiple Vitamins-Minerals (MULTIVITAMIN ADULT PO) Take 1 tablet by mouth daily.    [provider]     Results for orders placed or performed during the hospital encounter of 07/30/18 (from the past 48 hour(s))  Basic metabolic panel     Status: Abnormal   Collection Time: 07/30/18 12:38 AM  Result Value Ref Range   Sodium 136 135 - 145 mmol/L   Potassium 4.4 3.5 - 5.1 mmol/L   Chloride 105 98 - 111 mmol/L   CO2 24 22 - 32 mmol/L   Glucose, Bld 202 (H) 70 - 99 mg/dL   BUN 17 8 - 23 mg/dL   Creatinine, Ser 0.75 0.44 - 1.00 mg/dL   Calcium 8.7 (L) 8.9 - 10.3 mg/dL   GFR calc non Af Amer >60 >60 mL/min   GFR calc Af Amer >60 >60 mL/min   Anion gap 7 5 - 15    Comment: Performed at [facility], [address] [place], [place] [address]  CBC     Status: Abnormal   Collection Time: 07/30/18 12:38 AM  Result Value Ref Range   WBC 12.6 (H) 4.0 - 10.5 K/uL   RBC 4.83 3.87 - 5.11 MIL/uL   Hemoglobin 13.3 12.0 - 15.0 g/dL   HCT 41.2 36.0 - 46.0 %   MCV 85.3 80.0 - 100.0 fL   MCH 27.5 26.0 - 34.0 pg   MCHC 32.3 30.0 - 36.0 g/dL   RDW  13.3 11.5 - 15.5 %   Platelets 263 150 - 400 K/uL   nRBC 0.0 0.0 - 0.2 %    Comment: Performed at [facility], [address] [place], [place] [address]  Troponin I - ONCE - STAT     Status: None   Collection Time: 07/30/18 12:38 AM  Result Value Ref Range   Troponin I <0.03 <0.03 ng/mL    Comment: Performed at [facility], [address] [place], [place] [address]  Brain natriuretic peptide     Status: Abnormal   Collection Time: 07/30/18 12:38 AM  Result Value Ref Range   B Natriuretic Peptide 167.0 (H) 0.0 - 100.0 pg/mL    Comment: Performed at [facility], [address] [place], [place] [address]  SARS Coronavirus 2 (CEPHEID- Performed in [place] hospital lab), Hosp Order     Status: None   Collection Time: 07/30/18  1:33 AM  Result Value Ref Range   SARS  Coronavirus 2 NEGATIVE NEGATIVE    Comment: (NOTE) If result is NEGATIVE SARS-CoV-2 target nucleic acids are NOT DETECTED. The SARS-CoV-2 RNA is generally detectable in upper and lower  respiratory specimens during the acute phase of infection. The lowest  concentration of SARS-CoV-2 viral copies this assay can detect is 250  copies / mL. A negative result does not preclude SARS-CoV-2 infection  and should not be used as the sole basis for treatment or other  patient management decisions.  A negative result may occur with  improper specimen collection / handling, submission of specimen other  than nasopharyngeal swab, presence of viral mutation(s) within the  areas targeted by this assay, and inadequate number of viral copies  (<250 copies / mL). A negative result must be combined with clinical  observations, patient history, and epidemiological information. If result is POSITIVE SARS-CoV-2 target nucleic acids are DETECTED. The SARS-CoV-2 RNA is generally detectable in upper and lower  respiratory specimens dur ing the acute phase of infection.  Positive  results are indicative of active infection with SARS-CoV-2.  Clinical  correlation with patient history and other diagnostic information is  necessary to determine patient infection status.  Positive results do  not rule out bacterial infection or co-infection with other viruses. If result is PRESUMPTIVE POSTIVE SARS-CoV-2 nucleic acids MAY BE PRESENT.   A presumptive positive result was obtained on the submitted specimen  and confirmed on repeat testing.  While 2019 novel coronavirus  (SARS-CoV-2) nucleic acids may be present in the submitted sample  additional confirmatory testing may be necessary for epidemiological  and / or clinical management purposes  to differentiate between  SARS-CoV-2 and other Sarbecovirus currently known to infect humans.  If clinically indicated additional testing with an alternate test  methodology  [phone]) is advised. The SARS-CoV-2 RNA is generally  detectable in upper and lower respiratory sp ecimens during the acute  phase of infection. The expected result is Negative. Fact Sheet for Patients:  StrictlyIdeas.no Fact Sheet for Healthcare Providers: BankingDealers.co.za This test is not yet approved or cleared by the [place] FDA and has been authorized for detection and/or diagnosis of SARS-CoV-2 by FDA under an Emergency Use Authorization (EUA).  This EUA will remain in effect (meaning this test can be used) for the duration of the COVID-19 declaration under Section 564(b)(1) of the Act, 21 U.S.C. section 360bbb-3(b)(1), unless the authorization is terminated or revoked sooner. Performed at [facility], [address] [place], [place] [address]   Lactic  acid, plasma     Status: None   Collection Time: 07/30/18  2:13 AM  Result Value Ref Range   Lactic Acid, Venous 1.0 0.5 - 1.9 mmol/L    Comment: Performed at [facility], [place], [place] [address]   Ct Angio Chest Pe W And/or Wo Contrast  Result Date: 07/30/2018 CLINICAL DATA:  Chest pain, shortness of breath x3 days EXAM: CT ANGIOGRAPHY CHEST WITH CONTRAST TECHNIQUE: Multidetector CT imaging of the chest was performed using the standard protocol during bolus administration of intravenous contrast. Multiplanar CT image reconstructions and MIPs were obtained to evaluate the vascular anatomy. CONTRAST:  14m OMNIPAQUE IOHEXOL 350 MG/ML SOLN COMPARISON:  Chest radiograph dated 07/30/2018 FINDINGS: Cardiovascular: Satisfactory opacification of the bilateral pulmonary arteries to the segmental level. No evidence of pulmonary embolism. Heart is normal in size.  No pericardial effusion. Mild atherosclerotic calcifications of the aortic arch. Enlargement of the main pulmonary artery, suggesting pulmonary arterial hypertension. Three vessel coronary  atherosclerosis. Mediastinum/Nodes: Suspected 9 mm short axis subcarinal node (series 5/image 39), within normal limits. No suspicious mediastinal lymphadenopathy. Visualized thyroid is unremarkable. Lungs/Pleura: Large right pleural effusion. Associated right upper, middle, and lower lobe compressive atelectasis. Mild interlobular septal thickening in the right upper lobe may reflect asymmetric edema (series 7/image 24), possibly related to lung re-expansion in the setting of the effusion. Small left pleural effusion.  Mild left basilar atelectasis. No suspicious pulmonary nodules, noting poor visualization of the right lung. No pneumothorax. Upper Abdomen: Nodular hepatic contour, raising the possibility of cirrhosis. Calcified hepatic and splenic granulomata. Vascular calcifications. Musculoskeletal: Mild degenerative changes of the visualized thoracolumbar spine. Review of the MIP images confirms the above findings. IMPRESSION: No evidence of pulmonary embolism. Enlargement of the main pulmonary artery, suggesting pulmonary arterial hypertension. Large right pleural effusion with associated right lung atelectasis. Small left pleural effusion. Nodular hepatic contour, suggesting cirrhosis. Aortic Atherosclerosis (ICD10-I70.0). Electronically Signed   By: SJulian HyM.D.   On: 07/30/2018 04:06   Dg Chest Portable 1 View  Result Date: 07/30/2018 CLINICAL DATA:  Chest pain EXAM: PORTABLE CHEST 1 VIEW COMPARISON:  None. FINDINGS: Right greater than left pleural effusions. Obscured cardiomediastinal silhouette. Vascular congestion. Diffuse opacity in the right thorax, likely combination of layering effusion and underlying edema. Focal consolidations in the right upper lobe and bilateral lung bases. Aortic atherosclerosis. No pneumothorax. IMPRESSION: 1. Cardiomegaly with vascular congestion and mild interstitial edema. 2. Right greater than left pleural effusions. Diffuse hazy asymmetric opacity in the  right thorax may be due to combination of layering effusion and underlying edema 3. Multifocal consolidations in the right upper lobe and bilateral lung bases may reflect pneumonia Electronically Signed   By: KDonavan FoilM.D.   On: 07/30/2018 01:39    Review of Systems  Constitutional: Negative for chills and fever.  HENT: Negative for sore throat and tinnitus.   Eyes: Negative for blurred vision and redness.  Respiratory: Positive for shortness of breath. Negative for cough.   Cardiovascular: Negative for chest pain, palpitations, orthopnea and PND.  Gastrointestinal: Negative for abdominal pain, diarrhea, nausea and vomiting.  Genitourinary: Negative for dysuria, frequency and urgency.  Musculoskeletal: Negative for joint pain and myalgias.  Skin: Negative for rash.       No lesions  Neurological: Negative for speech change, focal weakness and weakness.  Endo/Heme/Allergies: Does not bruise/bleed easily.       No temperature intolerance  Psychiatric/Behavioral: Negative for depression and suicidal ideas.  Blood pressure (!) 147/92, pulse (!) 104, temperature 98.6 F (37 C), temperature source Oral, resp. rate (!) 24, height '5\' 6"'  (1.676 m), weight 77.1 kg, SpO2 97 %. Physical Exam  Vitals reviewed. Constitutional: She is oriented to person, place, and time. She appears well-developed and well-nourished. No distress.  HENT:  Head: Normocephalic and atraumatic.  Mouth/Throat: Oropharynx is clear and moist.  Eyes: Pupils are equal, round, and reactive to light. Conjunctivae and EOM are normal. No scleral icterus.  Neck: Normal range of motion. Neck supple. No JVD present. No tracheal deviation present. No thyromegaly present.  Cardiovascular: Normal rate, regular rhythm and normal heart sounds. Exam reveals no gallop and no friction rub.  No murmur heard. Respiratory: Effort normal and breath sounds normal.  GI: Soft. Bowel sounds are normal. She exhibits no distension. There is  no abdominal tenderness.  Genitourinary:    Genitourinary Comments: Deferred   Musculoskeletal: Normal range of motion.        General: No edema.  Lymphadenopathy:    She has no cervical adenopathy.  Neurological: She is alert and oriented to person, place, and time. No cranial nerve deficit. She exhibits normal muscle tone.  Skin: Skin is warm and dry. No rash noted. No erythema.  Psychiatric: She has a normal mood and affect. Her behavior is normal. Judgment and thought content normal.     Assessment/Plan This is an 85 year old female admitted for pneumonia. 1.  Pneumonia: Community-acquired; tinea ceftriaxone and azithromycin.  Supplemental oxygen as needed.  Associated pleural effusions may need drainage by interventional radiology. 2.  Sepsis: The patient meets criteria via tachycardia, tachypnea and leukocytosis.  She is hemodynamically stable.  Follow blood cultures for growth and sensitivities. 3.  Atrial fibrillation: Continue metoprolol and aspirin. 4.  Overweight: BMI is 27.4; encouraged healthy diet and exercise 5.  DVT prophylaxis: Lovenox 6.  GI prophylaxis: None The patient is a full code.  Time spent on admission orders and patient care approximately 45 minutes  Harrie Foreman, MD 07/30/2018, 8:22 AM

## 2018-07-30 NOTE — ED Notes (Signed)
[phone] daughter Raynelle Fanning updated on patient being admitted per pt's request.

## 2018-07-30 NOTE — Progress Notes (Signed)
Pharmacist Physician Communication  Azithromycin has been scheduled for administration tomorrow morning, which will be 24 hours after levofloxacin. These two medications should not be administered within 24 hours of each other due to risk of QTc prolongation.  Pricilla Riffle, PharmD Pharmacy Resident  07/30/2018 3:41 PM

## 2018-07-30 NOTE — ED Provider Notes (Signed)
[facility] Emergency Department Provider Note    First MD Initiated Contact with Patient 07/30/18 0032     (approximate)  I have reviewed the triage vital signs and the nursing notes.   HISTORY  Chief Complaint Shortness of Breath    HPI Lisa Moon is a 85 y.o. female with history of atrial fibrillation not on anticoagulation presents to the emergency department via [facility] secondary to progressive dyspnea x3 days with accompanying chest pressure tonight.  Patient states that "I just could not breathe".  Patient was given 2 sublingual nitro 324 of aspirin and 4 mg of oral Zofran in route.  Patient states that chest discomfort is improved at this time however dyspnea is persisted.  Patient's oxygen saturation 94% on room air on arrival with apparent increase work of breathing.        Past Medical History:  Diagnosis Date  . A-fib (HCC)     There are no active problems to display for this patient.     Prior to Admission medications   Medication Sig Start Date End Date Taking? Authorizing Provider  aspirin EC 81 MG tablet Take 81 mg by mouth daily.    [provider]  Calcium Carbonate-Vitamin D (CALTRATE 600+D PO) Take 1 tablet by mouth 2 (two) times daily with a meal.    [provider]  lovastatin (MEVACOR) 40 MG tablet Take 40 mg by mouth at bedtime. 05/25/18   [provider]  metoprolol tartrate (LOPRESSOR) 50 MG tablet Take 50 mg by mouth 2 (two) times daily. 06/15/18   [provider]  Multiple Vitamins-Minerals (MULTIVITAMIN ADULT PO) Take 1 tablet by mouth daily.    [provider]    Allergies Sulfa antibiotics and Penicillins  No family history on file.  Social History Social History   Tobacco Use  . Smoking status: Not on file  Substance Use Topics  . Alcohol use: Not on file  . Drug use: Not on file    Review of Systems Constitutional: No fever/chills Eyes: No visual  changes. ENT: No sore throat. Cardiovascular: Positive for chest pain. Respiratory: Positive for shortness of breath. Gastrointestinal: No abdominal pain.  No nausea, no vomiting.  No diarrhea.  No constipation. Genitourinary: Negative for dysuria. Musculoskeletal: Negative for neck pain.  Negative for back pain. Integumentary: Negative for rash. Neurological: Negative for headaches, focal weakness or numbness.   ____________________________________________   PHYSICAL EXAM:  VITAL SIGNS: ED Triage Vitals [07/30/18 0032]  Enc Vitals Group     BP      Pulse Rate 100     Resp (!) 24     Temp 98.6 F (37 C)     Temp Source Oral     SpO2 92 %     Weight      Height      Head Circumference      Peak Flow      Pain Score      Pain Loc      Pain Edu?      Excl. in GC?     Constitutional: Alert and oriented.  Apparent respiratory difficulty  eyes: Conjunctivae are normal.  Mouth/Throat: Mucous membranes are moist.  Oropharynx non-erythematous. Neck: No stridor.   Cardiovascular: Tachycardia, regular rhythm. Good peripheral circulation. Grossly normal heart sounds. Respiratory: Tachypnea, positive accessory respiratory muscle use, diffuse coarse rhonchi noted right lung field basilar Gastrointestinal: Soft and nontender. No distention.  Musculoskeletal: No lower extremity tenderness nor edema.  No gross deformities of extremities. Neurologic:  Normal speech and language. No gross focal neurologic deficits are appreciated.  Skin:  Skin is warm, dry and intact. No rash noted. Psychiatric: Mood and affect are normal. Speech and behavior are normal.  ____________________________________________   LABS (all labs ordered are listed, but only abnormal results are displayed)  Labs Reviewed  BASIC METABOLIC PANEL - Abnormal; Notable for the following components:      Result Value   Glucose, Bld 202 (*)    Calcium 8.7 (*)    All other components within normal limits  CBC -  Abnormal; Notable for the following components:   WBC 12.6 (*)    All other components within normal limits  BRAIN NATRIURETIC PEPTIDE - Abnormal; Notable for the following components:   B Natriuretic Peptide 167.0 (*)    All other components within normal limits  SARS CORONAVIRUS 2 (HOSPITAL ORDER, PERFORMED IN [facility])  CULTURE, BLOOD (ROUTINE X 2)  CULTURE, BLOOD (ROUTINE X 2)  TROPONIN I  LACTIC ACID, PLASMA  LACTIC ACID, PLASMA   ____________________________________________  EKG  ED ECG REPORT I, [place] N BROWN, the attending physician, personally viewed and interpreted this ECG.   Date: 07/30/2018  EKG Time: 12:34 AM  Rate: 110  Rhythm: Sinus tachycardia  Axis: Normal  Intervals: Normal  ST&T Change: None  ____________________________________________  RADIOLOGY I, [place] N BROWN, personally viewed and evaluated these images (plain radiographs) as part of my medical decision making, as well as reviewing the written report by the radiologist.  ED MD interpretation: Diffuse hazy opacity of the right lung with multifocal consolidation may reflect pneumonia per radiologist on chest x-ray interpretation.  CT scan of the chest revealed large right pleural effusion with associated right lung atelectasis and a small left pleural effusion per radiologist.  Official radiology report(s): Ct Angio Chest Pe W And/or Wo Contrast  Result Date: 07/30/2018 CLINICAL DATA:  Chest pain, shortness of breath x3 days EXAM: CT ANGIOGRAPHY CHEST WITH CONTRAST TECHNIQUE: Multidetector CT imaging of the chest was performed using the standard protocol during bolus administration of intravenous contrast. Multiplanar CT image reconstructions and MIPs were obtained to evaluate the vascular anatomy. CONTRAST:  75mL OMNIPAQUE IOHEXOL 350 MG/ML SOLN COMPARISON:  Chest radiograph dated 07/30/2018 FINDINGS: Cardiovascular: Satisfactory opacification of the bilateral pulmonary arteries  to the segmental level. No evidence of pulmonary embolism. Heart is normal in size.  No pericardial effusion. Mild atherosclerotic calcifications of the aortic arch. Enlargement of the main pulmonary artery, suggesting pulmonary arterial hypertension. Three vessel coronary atherosclerosis. Mediastinum/Nodes: Suspected 9 mm short axis subcarinal node (series 5/image 39), within normal limits. No suspicious mediastinal lymphadenopathy. Visualized thyroid is unremarkable. Lungs/Pleura: Large right pleural effusion. Associated right upper, middle, and lower lobe compressive atelectasis. Mild interlobular septal thickening in the right upper lobe may reflect asymmetric edema (series 7/image 24), possibly related to lung re-expansion in the setting of the effusion. Small left pleural effusion.  Mild left basilar atelectasis. No suspicious pulmonary nodules, noting poor visualization of the right lung. No pneumothorax. Upper Abdomen: Nodular hepatic contour, raising the possibility of cirrhosis. Calcified hepatic and splenic granulomata. Vascular calcifications. Musculoskeletal: Mild degenerative changes of the visualized thoracolumbar spine. Review of the MIP images confirms the above findings. IMPRESSION: No evidence of pulmonary embolism. Enlargement of the main pulmonary artery, suggesting pulmonary arterial hypertension. Large right pleural effusion with associated right lung atelectasis. Small left pleural effusion. Nodular hepatic contour, suggesting cirrhosis. Aortic Atherosclerosis (ICD10-I70.0). Electronically Signed  By: Charline Bills M.D.   On: 07/30/2018 04:06   Dg Chest Portable 1 View  Result Date: 07/30/2018 CLINICAL DATA:  Chest pain EXAM: PORTABLE CHEST 1 VIEW COMPARISON:  None. FINDINGS: Right greater than left pleural effusions. Obscured cardiomediastinal silhouette. Vascular congestion. Diffuse opacity in the right thorax, likely combination of layering effusion and underlying edema. Focal  consolidations in the right upper lobe and bilateral lung bases. Aortic atherosclerosis. No pneumothorax. IMPRESSION: 1. Cardiomegaly with vascular congestion and mild interstitial edema. 2. Right greater than left pleural effusions. Diffuse hazy asymmetric opacity in the right thorax may be due to combination of layering effusion and underlying edema 3. Multifocal consolidations in the right upper lobe and bilateral lung bases may reflect pneumonia Electronically Signed   By: Jasmine Pang M.D.   On: 07/30/2018 01:39     .Critical Care Performed by: Darci Current, MD Authorized by: Darci Current, MD   Critical care provider statement:    Critical care time (minutes):  45   Critical care time was exclusive of:  Separately billable procedures and treating other patients   Critical care was necessary to treat or prevent imminent or life-threatening deterioration of the following conditions:  Respiratory failure   Critical care was time spent personally by me on the following activities:  Development of treatment plan with patient or surrogate, discussions with consultants, evaluation of patient's response to treatment, examination of patient, obtaining history from patient or surrogate, ordering and performing treatments and interventions, ordering and review of laboratory studies, ordering and review of radiographic studies, pulse oximetry, re-evaluation of patient's condition and review of old charts   I assumed direction of critical care for this patient from another provider in my specialty: no       ____________________________________________   INITIAL IMPRESSION / MDM / ASSESSMENT AND PLAN / ED COURSE  As part of my medical decision making, I reviewed the following data within the electronic MEDICAL RECORD NUMBER   85 year old female presenting with above-stated history and physical exam with apparent respiratory difficulty.  Concern for possible pneumonia pulmonary emboli pleural  effusion congestive heart failure and  COVID-19.  Laboratory data notable for an elevated white blood cell count of 12.6 however lactic acid normal at 1.0 glucose elevated at 202.  Chest x-ray revealed evidence of possible pleural effusion with possible concomitant pneumonia.  CT scan revealed a large right pleural effusion.  Patient given DuoNeb breathing treatments while in the emergency department.  Patient discussed with Dr. Sheryle Hail for hospital admission for further evaluation and management.     *Leigha Mccary was evaluated in Emergency Department on 07/30/2018 for the symptoms described in the history of present illness. She was evaluated in the context of the global COVID-19 pandemic, which necessitated consideration that the patient might be at risk for infection with the SARS-CoV-2 virus that causes COVID-19. Institutional protocols and algorithms that pertain to the evaluation of patients at risk for COVID-19 are in a state of rapid change based on information released by regulatory bodies including the CDC and federal and state organizations. These policies and algorithms were followed during the patient's care in the ED.  Some ED evaluations and interventions may be delayed as a result of limited staffing during the pandemic.*   Clinical Course as of Jul 30 435  Sat Jul 30, 2018  7209 Blood Culture (routine x 2) [RB]    Clinical Course User Index [RB] Darci Current, MD     ____________________________________________  FINAL CLINICAL IMPRESSION(S) / ED DIAGNOSES  Final diagnoses:  Acute respiratory failure with hypoxia (HCC)  Pleural effusion     MEDICATIONS GIVEN DURING THIS VISIT:  Medications  levofloxacin (LEVAQUIN) IVPB 750 mg (0 mg Intravenous Stopped 07/30/18 0411)  iohexol (OMNIPAQUE) 350 MG/ML injection 75 mL (75 mLs Intravenous Contrast Given 07/30/18 0231)     ED Discharge Orders    None       Note:  This document was prepared using Dragon voice  recognition software and may include unintentional dictation errors.   Darci CurrentBrown, [place] N, MD 07/30/18 [phone]

## 2018-07-30 NOTE — Consult Note (Addendum)
Pulmonary Medicine          Date: 07/30/2018,   MRN# [id number] Lisa Moon 03-12-1932     AdmissionWeight: 77.1 kg                 CurrentWeight: 77.1 kg      CHIEF COMPLAINT:    Worsening shortness of breath over the past 3 months  HISTORY OF PRESENT ILLNESS   This is a very pleasant 85 year old female with a history of atrial fibrillation, dyslipidemia, essential hypertension who was brought in for worsening shortness of breath with possible community-acquired pneumonia.  She shares that over the last 6 months she has progressively gotten more short of breath and had decreased her activity due to dyspnea.  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  She does endorse approximately 5 to 10 pound weight loss over the last 2 months despite normal appetite.  Hospitalist service had called pulmonary consultation due to acute hypoxemic respiratory failure with abnormal CT chest showing pleural effusions with atelectasis and infiltrate.  Review of her CAT scan independently by me does show bilateral pleural effusions right worse than left with mild subcarinal lymphadenopathy and by basilar atelectasis cannot rule out infiltrate.  Review of blood work shows mild leukocytosis without a differential.  Coronavirus have been ruled out and procalcitonin is in reference range.  She denies constitutional symptoms aside from weight loss, denies flulike illness, denies cough, denies fevers.   PAST MEDICAL HISTORY   Past Medical History:  Diagnosis Date  . A-fib (HCC)   . HLD (hyperlipidemia)   . Hypertension      SURGICAL HISTORY   Past Surgical History:  Procedure Laterality Date  . ABDOMINAL HYSTERECTOMY    . CARDIAC ELECTROPHYSIOLOGY MAPPING AND ABLATION       FAMILY HISTORY   Family History  Problem Relation Age of Onset  . Liver cancer Mother   . Heart attack  Mother   . CAD Father      SOCIAL HISTORY   Social History   Tobacco Use  . Smoking status: Never Smoker  . Smokeless tobacco: Never Used  Substance Use Topics  . Alcohol use: Not on file  . Drug use: Not on file     MEDICATIONS    Home Medication: as per [facility]   Current Medication:  Current Facility-Administered Medications:  .  acetaminophen (TYLENOL) tablet 650 mg, 650 mg, Oral, Q6H PRN **OR** acetaminophen (TYLENOL) suppository 650 mg, 650 mg, Rectal, Q6H PRN, Arnaldo Natal, MD .  aspirin EC tablet 81 mg, 81 mg, Oral, Daily, Arnaldo Natal, MD, 81 mg at 07/30/18 0947 .  cefTRIAXone (ROCEPHIN) 1 g in sodium chloride 0.9 % 100 mL IVPB, 1 g, Intravenous, Q24H, Arnaldo Natal, MD, Last Rate: 200 mL/hr at 07/30/18 0947, 1 g at 07/30/18 0947 .  docusate sodium (COLACE) capsule 100 mg, 100 mg, Oral, BID, Arnaldo Natal, MD, 100 mg at 07/30/18 0947 .  enoxaparin (LOVENOX) injection 40 mg, 40 mg, Subcutaneous, Q24H, Arnaldo Natal, MD, 40 mg at 07/30/18 0948 .  metoprolol tartrate (LOPRESSOR) tablet 50 mg, 50 mg, Oral, BID, Arnaldo Natal, MD, 50 mg at 07/30/18 0948 .  multivitamin with minerals tablet 1 tablet, 1 tablet, Oral, Daily, [place] Bilberry, MD, 1 tablet at 07/30/18 [id number] .  ondansetron (ZOFRAN) tablet 4 mg, 4  mg, Oral, Q6H PRN **OR** ondansetron (ZOFRAN) injection 4 mg, 4 mg, Intravenous, Q6H PRN, Arnaldo Nataliamond, Michael S, MD .  pravastatin (PRAVACHOL) tablet 20 mg, 20 mg, Oral, q1800, [place] BilberryPatel, Shreyang, MD    ALLERGIES   Sulfa antibiotics and Penicillins     REVIEW OF SYSTEMS    Review of Systems:  Gen:  Denies  fever, sweats, chills weigh loss  HEENT: Denies blurred vision, double vision, ear pain, eye pain, hearing loss, nose bleeds, sore throat Cardiac:  No dizziness, chest pain or heaviness, chest tightness,edema Resp:   Denies cough or sputum porduction, shortness of breath,wheezing, hemoptysis,  Gi: Denies swallowing difficulty, stomach  pain, nausea or vomiting, diarrhea, constipation, bowel incontinence Gu:  Denies bladder incontinence, burning urine Ext:   Denies Joint pain, stiffness or swelling Skin: Denies  skin rash, easy bruising or bleeding or hives Endoc:  Denies polyuria, polydipsia , polyphagia or weight change Psych:   Denies depression, insomnia or hallucinations   Other:  All other systems negative   VS: BP 130/76   Pulse 86   Temp (!) 96.6 F (35.9 C) (Axillary)   Resp 16   Ht 5\' 6"  (1.676 m)   Wt 77.1 kg   SpO2 98%   BMI 27.44 kg/m      PHYSICAL EXAM    GENERAL:NAD, no fevers, chills, no weakness no fatigue HEAD: Normocephalic, atraumatic.  EYES: Pupils equal, round, reactive to light. Extraocular muscles intact. No scleral icterus.  MOUTH: Moist mucosal membrane. Dentition intact. No abscess noted.  EAR, NOSE, THROAT: Clear without exudates. No external lesions.  NECK: Supple. No thyromegaly. No nodules. No JVD.  PULMONARY: Diffuse coarse rhonchi right sided +wheezes CARDIOVASCULAR: S1 and S2. Regular rate and rhythm. No murmurs, rubs, or gallops. No edema. Pedal pulses 2+ bilaterally.  GASTROINTESTINAL: Soft, nontender, nondistended. No masses. Positive bowel sounds. No hepatosplenomegaly.  MUSCULOSKELETAL: No swelling, clubbing, or edema. Range of motion full in all extremities.  NEUROLOGIC: Cranial nerves II through XII are intact. No gross focal neurological deficits. Sensation intact. Reflexes intact.  SKIN: No ulceration, lesions, rashes, or cyanosis. Skin warm and dry. Turgor intact.  PSYCHIATRIC: Mood, affect within normal limits. The patient is awake, alert and oriented x 3. Insight, judgment intact.       IMAGING    Ct Angio Chest Pe W And/or Wo Contrast  Result Date: 07/30/2018 CLINICAL DATA:  Chest pain, shortness of breath x3 days EXAM: CT ANGIOGRAPHY CHEST WITH CONTRAST TECHNIQUE: Multidetector CT imaging of the chest was performed using the standard protocol during  bolus administration of intravenous contrast. Multiplanar CT image reconstructions and MIPs were obtained to evaluate the vascular anatomy. CONTRAST:  75mL OMNIPAQUE IOHEXOL 350 MG/ML SOLN COMPARISON:  Chest radiograph dated 07/30/2018 FINDINGS: Cardiovascular: Satisfactory opacification of the bilateral pulmonary arteries to the segmental level. No evidence of pulmonary embolism. Heart is normal in size.  No pericardial effusion. Mild atherosclerotic calcifications of the aortic arch. Enlargement of the main pulmonary artery, suggesting pulmonary arterial hypertension. Three vessel coronary atherosclerosis. Mediastinum/Nodes: Suspected 9 mm short axis subcarinal node (series 5/image 39), within normal limits. No suspicious mediastinal lymphadenopathy. Visualized thyroid is unremarkable. Lungs/Pleura: Large right pleural effusion. Associated right upper, middle, and lower lobe compressive atelectasis. Mild interlobular septal thickening in the right upper lobe may reflect asymmetric edema (series 7/image 24), possibly related to lung re-expansion in the setting of the effusion. Small left pleural effusion.  Mild left basilar atelectasis. No suspicious pulmonary nodules, noting poor visualization of the right lung.  No pneumothorax. Upper Abdomen: Nodular hepatic contour, raising the possibility of cirrhosis. Calcified hepatic and splenic granulomata. Vascular calcifications. Musculoskeletal: Mild degenerative changes of the visualized thoracolumbar spine. Review of the MIP images confirms the above findings. IMPRESSION: No evidence of pulmonary embolism. Enlargement of the main pulmonary artery, suggesting pulmonary arterial hypertension. Large right pleural effusion with associated right lung atelectasis. Small left pleural effusion. Nodular hepatic contour, suggesting cirrhosis. Aortic Atherosclerosis (ICD10-I70.0). Electronically Signed   By: Charline Bills M.D.   On: 07/30/2018 04:06   Dg Chest Portable 1  View  Result Date: 07/30/2018 CLINICAL DATA:  Chest pain EXAM: PORTABLE CHEST 1 VIEW COMPARISON:  None. FINDINGS: Right greater than left pleural effusions. Obscured cardiomediastinal silhouette. Vascular congestion. Diffuse opacity in the right thorax, likely combination of layering effusion and underlying edema. Focal consolidations in the right upper lobe and bilateral lung bases. Aortic atherosclerosis. No pneumothorax. IMPRESSION: 1. Cardiomegaly with vascular congestion and mild interstitial edema. 2. Right greater than left pleural effusions. Diffuse hazy asymmetric opacity in the right thorax may be due to combination of layering effusion and underlying edema 3. Multifocal consolidations in the right upper lobe and bilateral lung bases may reflect pneumonia Electronically Signed   By: Jasmine Pang M.D.   On: 07/30/2018 01:39         ASSESSMENT/PLAN     Acute hypoxemic respiratory failure   -Currently requiring 2 L/min nasal cannula oxygen therapy at rest   -This is likely due to compressive atelectasis secondary to large pleural effusion worse on right  -Patient does not have a history of congestive heart failure with most recent transthoracic echo (2019)showing ejection fraction 55% and moderate mitral regurgitation as well as aortic regurgitation. -BNP collected today is mildly elevated at 167.0 - will repeat transthoracic echo today -will perform diagnostic and therapeutic thoracentesis -Differential includes possible malignant effusion versus exudate due to parapneumonic process versus simple transudative effusion.       Bilateral pleural effusions    -will perform diagnostic and therapeutic thoracentesis today    -Fluid studies will be sent for cytology as well as microbiology     Bilateral atelectasis   -Aggressive bronchopulmonary hygiene   -Would favor not using albuterol or DuoNeb's due to atrial fibrillation and risk of rapid ventricular response  -Incentive  spirometer at bedside to be used multiple times each hour please encourage patient to use     Possible community-acquired pneumonia -Currently on Rocephin will add Zithromax as well plan for 5 to 7-day course       Thank you for allowing me to participate in the care of this patient.     Patient/Family are satisfied with care plan and all questions have been answered.  This document was prepared using Dragon voice recognition software and may include unintentional dictation errors.     Vida Rigger, M.D.  Division of Pulmonary & Critical Care Medicine  [facility]

## 2018-07-31 LAB — COMPREHENSIVE METABOLIC PANEL
ALT: 26 U/L (ref 0–44)
AST: 25 U/L (ref 15–41)
Albumin: 3 g/dL — ABNORMAL LOW (ref 3.5–5.0)
Alkaline Phosphatase: 91 U/L (ref 38–126)
Anion gap: 8 (ref 5–15)
BUN: 19 mg/dL (ref 8–23)
CO2: 26 mmol/L (ref 22–32)
Calcium: 8.1 mg/dL — ABNORMAL LOW (ref 8.9–10.3)
Chloride: 102 mmol/L (ref 98–111)
Creatinine, Ser: 0.79 mg/dL (ref 0.44–1.00)
GFR calc Af Amer: >60 mL/min (ref >60)
GFR calc non Af Amer: >60 mL/min (ref >60)
Glucose, Bld: 148 mg/dL — ABNORMAL HIGH (ref 70–99)
Potassium: 4.1 mmol/L (ref 3.5–5.1)
Sodium: 136 mmol/L (ref 135–145)
Total Bilirubin: 0.6 mg/dL (ref 0.3–1.2)
Total Protein: 6.3 g/dL — ABNORMAL LOW (ref 6.5–8.1)

## 2018-07-31 LAB — GRAM STAIN

## 2018-07-31 MED ORDER — FLUTICASONE PROPIONATE 50 MCG/ACT NA SUSP
2.0000 | Freq: Every day | NASAL | Status: DC
  Administered 2018-07-31 – 2018-08-01 (×2): 2 via NASAL
  Filled 2018-07-31: qty 16

## 2018-07-31 MED ORDER — FUROSEMIDE 10 MG/ML IJ SOLN
40.0000 mg | Freq: Two times a day (BID) | INTRAMUSCULAR | Status: DC
  Administered 2018-07-31 – 2018-08-01 (×2): 40 mg via INTRAVENOUS
  Filled 2018-07-31 (×2): qty 4

## 2018-07-31 MED ORDER — ALBUMIN HUMAN 25 % IV SOLN
12.5000 g | Freq: Once | INTRAVENOUS | Status: AC
  Administered 2018-07-31: 22:00:00 12.5 g via INTRAVENOUS
  Filled 2018-07-31: qty 50

## 2018-07-31 MED ORDER — SODIUM CHLORIDE 0.9 % IV SOLN
INTRAVENOUS | Status: DC | PRN
  Administered 2018-07-31 (×3): via INTRAVENOUS

## 2018-07-31 NOTE — Progress Notes (Signed)
[employer] [facility] [place] [facility]                                                                                                                                                                                  Patient Demographics   Lisa Moon, is a 85 y.o. female, DOB - 1932/05/18, ACZ:[id number]  Admit date - 07/30/2018   Admitting Physician Arnaldo Natal, MD  Outpatient Primary MD for the patient is Patient, No Pcp Per   LOS - 1  Subjective: Patient feeling better had fluid drained yesterday   Review of Systems:   CONSTITUTIONAL: No documented fever. No fatigue, weakness. No weight gain, no weight loss.  EYES: No blurry or double vision.  ENT: No tinnitus. No postnasal drip. No redness of the oropharynx.  RESPIRATORY: No cough, no wheeze, no hemoptysis.  Improved dyspnea.  CARDIOVASCULAR: No chest pain. No orthopnea. No palpitations. No syncope.  GASTROINTESTINAL: No nausea, no vomiting or diarrhea. No abdominal pain. No melena or hematochezia.  GENITOURINARY: No dysuria or hematuria.  ENDOCRINE: No polyuria or nocturia. No heat or cold intolerance.  HEMATOLOGY: No anemia. No bruising. No bleeding.  INTEGUMENTARY: No rashes. No lesions.  MUSCULOSKELETAL: No arthritis. No swelling. No gout.  NEUROLOGIC: No numbness, tingling, or ataxia. No seizure-type activity.  PSYCHIATRIC: No anxiety. No insomnia. No ADD.    Vitals:   Vitals:   07/31/18 0529 07/31/18 0909 07/31/18 0950 07/31/18 1026  BP: 132/66 120/73    Pulse: 82 81 99   Resp: 18     Temp: 97.8 F (36.6 C)     TempSrc: Oral     SpO2: 96%  92% 92%  Weight:      Height:        Wt Readings from Last 3 Encounters:  07/30/18 77.1 kg     Intake/Output Summary (Last 24 hours) at 07/31/2018 1233 Last data filed at 07/31/2018 1000 Gross per 24 hour  Intake 888.28 ml  Output 2200 ml  Net -1311.72 ml    Physical Exam:   GENERAL: Pleasant-appearing in no apparent distress.  HEAD, EYES,  EARS, NOSE AND THROAT: Atraumatic, normocephalic. Extraocular muscles are intact. Pupils equal and reactive to light. Sclerae anicteric. No conjunctival injection. No oro-pharyngeal erythema.  NECK: Supple. There is no jugular venous distention. No bruits, no lymphadenopathy, no thyromegaly.  HEART: Regular rate and rhythm,. No murmurs, no rubs, no clicks.  LUNGS: Diminished breath sounds in the right lung ABDOMEN: Soft, flat, nontender, nondistended. Has good bowel sounds. No hepatosplenomegaly appreciated.  EXTREMITIES: No evidence of any cyanosis, clubbing, or peripheral edema.  +2 pedal and radial pulses bilaterally.  NEUROLOGIC: The patient is alert,  awake, and oriented x3 with no focal motor or sensory deficits appreciated bilaterally.  SKIN: Moist and warm with no rashes appreciated.  Psych: Not anxious, depressed LN: No inguinal LN enlargement    Antibiotics   Anti-infectives (From admission, onward)   Start     Dose/Rate Route Frequency Ordered Stop   07/31/18 0800  azithromycin (ZITHROMAX) 500 mg in sodium chloride 0.9 % 250 mL IVPB     500 mg 250 mL/hr over 60 Minutes Intravenous Every 24 hours 07/30/18 1537     07/30/18 1000  cefTRIAXone (ROCEPHIN) 1 g in sodium chloride 0.9 % 100 mL IVPB     1 g 200 mL/hr over 30 Minutes Intravenous Every 24 hours 07/30/18 0848     07/30/18 0215  levofloxacin (LEVAQUIN) IVPB 750 mg     750 mg 100 mL/hr over 90 Minutes Intravenous  Once 07/30/18 0210 07/30/18 0411   07/30/18 0200  cefTRIAXone (ROCEPHIN) 2 g in sodium chloride 0.9 % 100 mL IVPB  Status:  Discontinued     2 g 200 mL/hr over 30 Minutes Intravenous Every 24 hours 07/30/18 0145 07/30/18 0207   07/30/18 0200  azithromycin (ZITHROMAX) 500 mg in sodium chloride 0.9 % 250 mL IVPB  Status:  Discontinued     500 mg 250 mL/hr over 60 Minutes Intravenous Every 24 hours 07/30/18 0145 07/30/18 0210      Medications   Scheduled Meds: . aspirin EC  81 mg Oral Daily  . docusate sodium   100 mg Oral BID  . fluticasone  2 spray Each Nare Daily  . metoprolol tartrate  50 mg Oral BID  . multivitamin with minerals  1 tablet Oral Daily  . pravastatin  20 mg Oral q1800   Continuous Infusions: . sodium chloride Stopped (07/31/18 0956)  . azithromycin 500 mg (07/31/18 0733)  . cefTRIAXone (ROCEPHIN)  IV Stopped (07/31/18 0950)   PRN Meds:.sodium chloride, acetaminophen **OR** acetaminophen, ondansetron **OR** ondansetron (ZOFRAN) IV   Data Review:   Micro Results Recent Results (from the past 240 hour(s))  SARS Coronavirus 2 (CEPHEID- Performed in [facility] hospital lab), Hosp Order     Status: None   Collection Time: 07/30/18  1:33 AM  Result Value Ref Range Status   SARS Coronavirus 2 NEGATIVE NEGATIVE Final    Comment: (NOTE) If result is NEGATIVE SARS-CoV-2 target nucleic acids are NOT DETECTED. The SARS-CoV-2 RNA is generally detectable in upper and lower  respiratory specimens during the acute phase of infection. The lowest  concentration of SARS-CoV-2 viral copies this assay can detect is 250  copies / mL. A negative result does not preclude SARS-CoV-2 infection  and should not be used as the sole basis for treatment or other  patient management decisions.  A negative result may occur with  improper specimen collection / handling, submission of specimen other  than nasopharyngeal swab, presence of viral mutation(s) within the  areas targeted by this assay, and inadequate number of viral copies  (<250 copies / mL). A negative result must be combined with clinical  observations, patient history, and epidemiological information. If result is POSITIVE SARS-CoV-2 target nucleic acids are DETECTED. The SARS-CoV-2 RNA is generally detectable in upper and lower  respiratory specimens dur ing the acute phase of infection.  Positive  results are indicative of active infection with SARS-CoV-2.  Clinical  correlation with patient history and other diagnostic  information is  necessary to determine patient infection status.  Positive results do  not rule out  bacterial infection or co-infection with other viruses. If result is PRESUMPTIVE POSTIVE SARS-CoV-2 nucleic acids MAY BE PRESENT.   A presumptive positive result was obtained on the submitted specimen  and confirmed on repeat testing.  While 2019 novel coronavirus  (SARS-CoV-2) nucleic acids may be present in the submitted sample  additional confirmatory testing may be necessary for epidemiological  and / or clinical management purposes  to differentiate between  SARS-CoV-2 and other Sarbecovirus currently known to infect humans.  If clinically indicated additional testing with an alternate test  methodology [phone]) is advised. The SARS-CoV-2 RNA is generally  detectable in upper and lower respiratory sp ecimens during the acute  phase of infection. The expected result is Negative. Fact Sheet for Patients:  BoilerBrush.com.cy Fact Sheet for Healthcare Providers: https://pope.com/ This test is not yet approved or cleared by the [place] FDA and has been authorized for detection and/or diagnosis of SARS-CoV-2 by FDA under an Emergency Use Authorization (EUA).  This EUA will remain in effect (meaning this test can be used) for the duration of the COVID-19 declaration under Section 564(b)(1) of the Act, 21 U.S.C. section 360bbb-3(b)(1), unless the authorization is terminated or revoked sooner. Performed at [facility], [address] [place], [place] [address]   Blood Culture (routine x 2)     Status: None (Preliminary result)   Collection Time: 07/30/18  2:13 AM  Result Value Ref Range Status   Specimen Description BLOOD LEFT HAND  Final   Special Requests   Final    BOTTLES DRAWN AEROBIC AND ANAEROBIC Blood Culture adequate volume   Culture   Final    NO GROWTH 1 DAY Performed at [facility], [address], [place], [place] [address]    Report Status PENDING  Incomplete  Blood Culture (routine x 2)     Status: None (Preliminary result)   Collection Time: 07/30/18  2:13 AM  Result Value Ref Range Status   Specimen Description BLOOD LEFT ANTECUBITAL  Final   Special Requests   Final    BOTTLES DRAWN AEROBIC AND ANAEROBIC Blood Culture results may not be optimal due to an excessive volume of blood received in culture bottles   Culture   Final    NO GROWTH 1 DAY Performed at [facility], [address] [place], [place] [address]    Report Status PENDING  Incomplete    Radiology Reports Dg Chest 1 View  Result Date: 07/30/2018 CLINICAL DATA:  RIGHT thoracentesis EXAM: CHEST  1 VIEW COMPARISON:  Radiograph 07/30/2018 is FINDINGS: Normal cardiac silhouette. Considerable reduction in RIGHT pleural effusion following thoracentesis. Pneumothorax identified. IMPRESSION: Reduction in RIGHT pleural fluid following thoracentesis. No pneumothorax identified. Electronically Signed   By: Genevive Bi M.D.   On: 07/30/2018 15:48   Ct Angio Chest Pe W And/or Wo Contrast  Result Date: 07/30/2018 CLINICAL DATA:  Chest pain, shortness of breath x3 days EXAM: CT ANGIOGRAPHY CHEST WITH CONTRAST TECHNIQUE: Multidetector CT imaging of the chest was performed using the standard protocol during bolus administration of intravenous contrast. Multiplanar CT image reconstructions and MIPs were obtained to evaluate the vascular anatomy. CONTRAST:  75mL OMNIPAQUE IOHEXOL 350 MG/ML SOLN COMPARISON:  Chest radiograph dated 07/30/2018 FINDINGS: Cardiovascular: Satisfactory opacification of the bilateral pulmonary arteries to the segmental level. No evidence of pulmonary embolism. Heart is normal in size.  No pericardial effusion. Mild atherosclerotic calcifications of the aortic arch. Enlargement of the main pulmonary artery, suggesting pulmonary arterial hypertension. Three vessel coronary  atherosclerosis.  Mediastinum/Nodes: Suspected 9 mm short axis subcarinal node (series 5/image 39), within normal limits. No suspicious mediastinal lymphadenopathy. Visualized thyroid is unremarkable. Lungs/Pleura: Large right pleural effusion. Associated right upper, middle, and lower lobe compressive atelectasis. Mild interlobular septal thickening in the right upper lobe may reflect asymmetric edema (series 7/image 24), possibly related to lung re-expansion in the setting of the effusion. Small left pleural effusion.  Mild left basilar atelectasis. No suspicious pulmonary nodules, noting poor visualization of the right lung. No pneumothorax. Upper Abdomen: Nodular hepatic contour, raising the possibility of cirrhosis. Calcified hepatic and splenic granulomata. Vascular calcifications. Musculoskeletal: Mild degenerative changes of the visualized thoracolumbar spine. Review of the MIP images confirms the above findings. IMPRESSION: No evidence of pulmonary embolism. Enlargement of the main pulmonary artery, suggesting pulmonary arterial hypertension. Large right pleural effusion with associated right lung atelectasis. Small left pleural effusion. Nodular hepatic contour, suggesting cirrhosis. Aortic Atherosclerosis (ICD10-I70.0). Electronically Signed   By: Charline Bills M.D.   On: 07/30/2018 04:06   Dg Chest Portable 1 View  Result Date: 07/30/2018 CLINICAL DATA:  Chest pain EXAM: PORTABLE CHEST 1 VIEW COMPARISON:  None. FINDINGS: Right greater than left pleural effusions. Obscured cardiomediastinal silhouette. Vascular congestion. Diffuse opacity in the right thorax, likely combination of layering effusion and underlying edema. Focal consolidations in the right upper lobe and bilateral lung bases. Aortic atherosclerosis. No pneumothorax. IMPRESSION: 1. Cardiomegaly with vascular congestion and mild interstitial edema. 2. Right greater than left pleural effusions. Diffuse hazy asymmetric opacity in the right thorax may be  due to combination of layering effusion and underlying edema 3. Multifocal consolidations in the right upper lobe and bilateral lung bases may reflect pneumonia Electronically Signed   By: Jasmine Pang M.D.   On: 07/30/2018 01:39     CBC Recent Labs  Lab 07/30/18 0038  WBC 12.6*  HGB 13.3  HCT 41.2  PLT 263  MCV 85.3  MCH 27.5  MCHC 32.3  RDW 13.3    Chemistries  Recent Labs  Lab 07/30/18 0038  NA 136  K 4.4  CL 105  CO2 24  GLUCOSE 202*  BUN 17  CREATININE 0.75  CALCIUM 8.7*   ------------------------------------------------------------------------------------------------------------------ estimated creatinine clearance is 55.9 mL/min (by C-G formula based on SCr of 0.75 mg/dL). ------------------------------------------------------------------------------------------------------------------ Recent Labs    07/30/18 0956  HGBA1C 6.9*   ------------------------------------------------------------------------------------------------------------------ No results for input(s): CHOL, HDL, LDLCALC, TRIG, CHOLHDL, LDLDIRECT in the last 72 hours. ------------------------------------------------------------------------------------------------------------------ Recent Labs    07/30/18 0956  TSH 2.141   ------------------------------------------------------------------------------------------------------------------ No results for input(s): VITAMINB12, FOLATE, FERRITIN, TIBC, IRON, RETICCTPCT in the last 72 hours.  Coagulation profile Recent Labs  Lab 07/30/18 1313  INR 1.1    No results for input(s): DDIMER in the last 72 hours.  Cardiac Enzymes Recent Labs  Lab 07/30/18 0038  TROPONINI <0.03   ------------------------------------------------------------------------------------------------------------------ Invalid input(s): POCBNP    Assessment & Plan  Patient is 85 year old female admitted with shortness of breath  1.  Acute respiratory failure Due to  pulmonary effusion Status post drainage Appears to be transudate Await further studies  2.  Sepsis ruled out  3.  Atrial fibrillation continue metoprolol and aspirin  4.  Hyperlipidemia resume home cholesterol medication    If okay with pulmonary can discharge tomorrow     Code Status Orders  (From admission, onward)         Start     Ordered   07/30/18 1354  Do not attempt resuscitation (DNR)  Continuous  Question Answer Comment  In the event of cardiac or respiratory ARREST Do not call a "code blue"   In the event of cardiac or respiratory ARREST Do not perform Intubation, CPR, defibrillation or ACLS   In the event of cardiac or respiratory ARREST Use medication by any route, position, wound care, and other measures to relive pain and suffering. May use oxygen, suction and manual treatment of airway obstruction as needed for comfort.      07/30/18 1354        Code Status History    Date Active Date Inactive Code Status Order ID Comments User Context   07/30/2018 0848 07/30/2018 1354 Full Code [id number]  Arnaldo Nataliamond, Michael S, MD Inpatient    Advance Directive Documentation     Most Recent Value  Type of Advance Directive  Living will  Pre-existing out of facility DNR order (yellow form or pink MOST form)  -  "MOST" Form in Place?  -           Consults pulmonary critical care  DVT Prophylaxis  Lovenox  Lab Results  Component Value Date   PLT 263 07/30/2018     Time Spent in minutes   35 minutes  Auburn BilberryShreyang Damarion Mendizabal M.D on 07/31/2018 at 12:33 PM  Between 7am to 6pm - Pager - [phone]  After 6pm go to [email or web address] - Social research officer, governmentpassword [email or web address]  [employer]   Office  [phone]

## 2018-07-31 NOTE — Progress Notes (Addendum)
Order received from Dr Allena Katz to discontinue telemetry, order flonase, and ambulate the patient

## 2018-07-31 NOTE — Progress Notes (Signed)
Pulmonary Medicine          Date: 07/31/2018,   MRN# [id number] Lisa HatchetJoanne Moon 1933-02-25     AdmissionWeight: 77.1 kg                 CurrentWeight: 77.1 kg         SUBJECTIVE    Patient reports significant improvement post thoracentesis.  She has been transitioned off of oxygen therapy. Fluid studies demonstrate mononuclear transudative effusion suggestive of chronic process and shows negative Gram stain with no organisms.  Cytology is pending.  Possible due to CHF, she is being diuresed, and TTE is pending.  May d/c post TTE with close follow on outpatient basis.  I have made arrangements to see her in clinic.    PAST MEDICAL HISTORY   Past Medical History:  Diagnosis Date  . A-fib (HCC)   . HLD (hyperlipidemia)   . Hypertension      SURGICAL HISTORY   Past Surgical History:  Procedure Laterality Date  . ABDOMINAL HYSTERECTOMY    . CARDIAC ELECTROPHYSIOLOGY MAPPING AND ABLATION       FAMILY HISTORY   Family History  Problem Relation Age of Onset  . Liver cancer Mother   . Heart attack Mother   . CAD Father      SOCIAL HISTORY   Social History   Tobacco Use  . Smoking status: Never Smoker  . Smokeless tobacco: Never Used  Substance Use Topics  . Alcohol use: Not on file  . Drug use: Not on file     MEDICATIONS    Home Medication:    Current Medication:  Current Facility-Administered Medications:  .  0.9 %  sodium chloride infusion, , Intravenous, PRN, Auburn BilberryPatel, Shreyang, MD, Stopped at 07/31/18 [phone] .  acetaminophen (TYLENOL) tablet 650 mg, 650 mg, Oral, Q6H PRN, 650 mg at 07/31/18 0606 **OR** acetaminophen (TYLENOL) suppository 650 mg, 650 mg, Rectal, Q6H PRN, Arnaldo Nataliamond, Michael S, MD .  aspirin EC tablet 81 mg, 81 mg, Oral, Daily, Arnaldo Nataliamond, Michael S, MD, 81 mg at 07/31/18 0914 .  azithromycin (ZITHROMAX) 500 mg in sodium chloride 0.9 % 250 mL IVPB, 500 mg, Intravenous, Q24H, Judd Mccubbin, MD, Last Rate: 250 mL/hr at 07/31/18  0733, 500 mg at 07/31/18 0733 .  cefTRIAXone (ROCEPHIN) 1 g in sodium chloride 0.9 % 100 mL IVPB, 1 g, Intravenous, Q24H, Arnaldo Nataliamond, Michael S, MD, Stopped at 07/31/18 [phone] .  docusate sodium (COLACE) capsule 100 mg, 100 mg, Oral, BID, Arnaldo Nataliamond, Michael S, MD, 100 mg at 07/31/18 0914 .  fluticasone (FLONASE) 50 MCG/ACT nasal spray 2 spray, 2 spray, Each Nare, Daily, Auburn BilberryPatel, Shreyang, MD, 2 spray at 07/31/18 1025 .  furosemide (LASIX) injection 40 mg, 40 mg, Intravenous, BID, Krizia Flight, MD .  metoprolol tartrate (LOPRESSOR) tablet 50 mg, 50 mg, Oral, BID, Arnaldo Nataliamond, Michael S, MD, 50 mg at 07/31/18 0914 .  multivitamin with minerals tablet 1 tablet, 1 tablet, Oral, Daily, Auburn BilberryPatel, Shreyang, MD, 1 tablet at 07/31/18 0914 .  ondansetron (ZOFRAN) tablet 4 mg, 4 mg, Oral, Q6H PRN **OR** ondansetron (ZOFRAN) injection 4 mg, 4 mg, Intravenous, Q6H PRN, Arnaldo Nataliamond, Michael S, MD .  pravastatin (PRAVACHOL) tablet 20 mg, 20 mg, Oral, q1800, Auburn BilberryPatel, Shreyang, MD, 20 mg at 07/31/18 1750    ALLERGIES   Sulfa antibiotics and Penicillins     REVIEW OF SYSTEMS    Review of Systems:  Gen:  Denies  fever, sweats, chills weigh loss  HEENT: Denies blurred vision,  double vision, ear pain, eye pain, hearing loss, nose bleeds, sore throat Cardiac:  No dizziness, chest pain or heaviness, chest tightness,edema Resp:   Denies cough or sputum porduction, shortness of breath,wheezing, hemoptysis,  Gi: Denies swallowing difficulty, stomach pain, nausea or vomiting, diarrhea, constipation, bowel incontinence Gu:  Denies bladder incontinence, burning urine Ext:   Denies Joint pain, stiffness or swelling Skin: Denies  skin rash, easy bruising or bleeding or hives Endoc:  Denies polyuria, polydipsia , polyphagia or weight change Psych:   Denies depression, insomnia or hallucinations   Other:  All other systems negative   VS: BP 122/80   Pulse 82   Temp 97.8 F (36.6 C) (Oral)   Resp 16   Ht 5\' 6"  (1.676 m)   Wt  77.1 kg   SpO2 97%   BMI 27.44 kg/m      PHYSICAL EXAM    GENERAL:NAD, no fevers, chills, no weakness no fatigue HEAD: Normocephalic, atraumatic.  EYES: Pupils equal, round, reactive to light. Extraocular muscles intact. No scleral icterus.  MOUTH: Moist mucosal membrane. Dentition intact. No abscess noted.  EAR, NOSE, THROAT: Clear without exudates. No external lesions.  NECK: Supple. No thyromegaly. No nodules. No JVD.  PULMONARY: Decreased breath sounds on right CARDIOVASCULAR: S1 and S2. Regular rate and rhythm. No murmurs, rubs, or gallops. No edema. Pedal pulses 2+ bilaterally.  GASTROINTESTINAL: Soft, nontender, nondistended. No masses. Positive bowel sounds. No hepatosplenomegaly.  MUSCULOSKELETAL: No swelling, clubbing, or edema. Range of motion full in all extremities.  NEUROLOGIC: Cranial nerves II through XII are intact. No gross focal neurological deficits. Sensation intact. Reflexes intact.  SKIN: No ulceration, lesions, rashes, or cyanosis. Skin warm and dry. Turgor intact.  PSYCHIATRIC: Mood, affect within normal limits. The patient is awake, alert and oriented x 3. Insight, judgment intact.       IMAGING    Dg Chest 1 View  Result Date: 07/30/2018 CLINICAL DATA:  RIGHT thoracentesis EXAM: CHEST  1 VIEW COMPARISON:  Radiograph 07/30/2018 is FINDINGS: Normal cardiac silhouette. Considerable reduction in RIGHT pleural effusion following thoracentesis. Pneumothorax identified. IMPRESSION: Reduction in RIGHT pleural fluid following thoracentesis. No pneumothorax identified. Electronically Signed   By: Genevive Bi M.D.   On: 07/30/2018 15:48   Ct Angio Chest Pe W And/or Wo Contrast  Result Date: 07/30/2018 CLINICAL DATA:  Chest pain, shortness of breath x3 days EXAM: CT ANGIOGRAPHY CHEST WITH CONTRAST TECHNIQUE: Multidetector CT imaging of the chest was performed using the standard protocol during bolus administration of intravenous contrast. Multiplanar CT image  reconstructions and MIPs were obtained to evaluate the vascular anatomy. CONTRAST:  43mL OMNIPAQUE IOHEXOL 350 MG/ML SOLN COMPARISON:  Chest radiograph dated 07/30/2018 FINDINGS: Cardiovascular: Satisfactory opacification of the bilateral pulmonary arteries to the segmental level. No evidence of pulmonary embolism. Heart is normal in size.  No pericardial effusion. Mild atherosclerotic calcifications of the aortic arch. Enlargement of the main pulmonary artery, suggesting pulmonary arterial hypertension. Three vessel coronary atherosclerosis. Mediastinum/Nodes: Suspected 9 mm short axis subcarinal node (series 5/image 39), within normal limits. No suspicious mediastinal lymphadenopathy. Visualized thyroid is unremarkable. Lungs/Pleura: Large right pleural effusion. Associated right upper, middle, and lower lobe compressive atelectasis. Mild interlobular septal thickening in the right upper lobe may reflect asymmetric edema (series 7/image 24), possibly related to lung re-expansion in the setting of the effusion. Small left pleural effusion.  Mild left basilar atelectasis. No suspicious pulmonary nodules, noting poor visualization of the right lung. No pneumothorax. Upper Abdomen: Nodular hepatic contour, raising the  possibility of cirrhosis. Calcified hepatic and splenic granulomata. Vascular calcifications. Musculoskeletal: Mild degenerative changes of the visualized thoracolumbar spine. Review of the MIP images confirms the above findings. IMPRESSION: No evidence of pulmonary embolism. Enlargement of the main pulmonary artery, suggesting pulmonary arterial hypertension. Large right pleural effusion with associated right lung atelectasis. Small left pleural effusion. Nodular hepatic contour, suggesting cirrhosis. Aortic Atherosclerosis (ICD10-I70.0). Electronically Signed   By: Charline Bills M.D.   On: 07/30/2018 04:06   Dg Chest Portable 1 View  Result Date: 07/30/2018 CLINICAL DATA:  Chest pain EXAM:  PORTABLE CHEST 1 VIEW COMPARISON:  None. FINDINGS: Right greater than left pleural effusions. Obscured cardiomediastinal silhouette. Vascular congestion. Diffuse opacity in the right thorax, likely combination of layering effusion and underlying edema. Focal consolidations in the right upper lobe and bilateral lung bases. Aortic atherosclerosis. No pneumothorax. IMPRESSION: 1. Cardiomegaly with vascular congestion and mild interstitial edema. 2. Right greater than left pleural effusions. Diffuse hazy asymmetric opacity in the right thorax may be due to combination of layering effusion and underlying edema 3. Multifocal consolidations in the right upper lobe and bilateral lung bases may reflect pneumonia Electronically Signed   By: Jasmine Pang M.D.   On: 07/30/2018 01:39      ASSESSMENT/PLAN    Acute hypoxemic respiratory failure   -Currently requiring 2 L/min nasal cannula oxygen therapy at rest   -This is likely due to compressive atelectasis secondary to large pleural effusion worse on right  -Patient does not have a history of congestive heart failure with most recent transthoracic echo (2019)showing ejection fraction 55% and moderate mitral regurgitation as well as aortic regurgitation.-repeating TTE -BNP collected today is only mildly elevated at 167.0 -Differential includes possible malignant effusion versus exudate due to parapneumonic process versus simple transudative effusion.      Bilateral pleural effusions    -Transudate of mononuclear fluid with negative Gram stain and no organisms seen unlikely infection related -Cytology pending -We will follow-up on outpatient basis in pulmonary clinic     Bilateral atelectasis   -Aggressive bronchopulmonary hygiene   -Would favor not using albuterol or DuoNeb's due to atrial fibrillation and risk of rapid ventricular response  -Incentive spirometer at bedside to be used multiple times each hour please encourage patient to use  -please send home with Acapella and IS    Possible community-acquired pneumonia -Currently on Rocephin will add Zithromax as well plan for 5 to 7-day course      Thank you for allowing me to participate in the care of this patient.   Patient/Family are satisfied with care plan and all questions have been answered.  This document was prepared using Dragon voice recognition software and may include unintentional dictation errors.     Vida Rigger, M.D.  Division of Pulmonary & Critical Care Medicine  [facility]

## 2018-07-31 NOTE — Progress Notes (Signed)
   07/31/18 0950  Therapy Vitals  Pulse Rate 99  Oxygen Therapy  SpO2 92 % (s/p 351ft AMB on room air; mild DOE compared to baseline)  O2 Device Room Air  Patient Activity (if Appropriate) Ambulating    9:50 AM, 07/31/18 Rosamaria Lints, PT, DPT Physical Therapist - [facility]  [phone] [facility])

## 2018-07-31 NOTE — Evaluation (Addendum)
Physical Therapy Evaluation Patient Details Name: Lisa HatchetJoanne Hauschild MRN: [id number] DOB: [birth year] Today's Date: 07/31/2018   History of Present Illness  Lisa Moon is an 85yoF [facility] c 67M SOB (Covid negative), admitted with what was inititally thought to be CAP. Pt now s/p 1.25L thoracentesis, improved breathing.   Clinical Impression  Pt performing all mobility at baseline level, however still with slight SOB during AMB. She tolerates nearly 42300ft on room air, SpO2: 92%. Pt reports to feel much better. PT will sign off at this time, as patient has no acute needs for skilled PT services at this time. Recommend AMB daily with nursing to prevent functional decline while admitted.     Follow Up Recommendations No PT follow up    Equipment Recommendations  None recommended by PT    Recommendations for Other Services       Precautions / Restrictions Precautions Precautions: None      Mobility  Bed Mobility Overal bed mobility: Independent                Transfers Overall transfer level: Independent Equipment used: None                Ambulation/Gait Ambulation/Gait assistance: Independent Gait Distance (Feet): 380 Feet Assistive device: None       General Gait Details: feels near baseline, still mildly SOB, but improved from prior to thoracentesis; SpO2 92% on room air.   Stairs            Wheelchair Mobility    Modified Rankin (Stroke Patients Only)       Balance Overall balance assessment: Independent;No apparent balance deficits (not formally assessed)                                           Pertinent Vitals/Pain Pain Assessment: No/denies pain    Home Living Family/patient expects to be discharged to:: Private residence Living Arrangements: Alone Available Help at Discharge: Family;Available 24 hours/day Type of Home: House Home Access: Stairs to enter Entrance Stairs-Rails: Can reach both Entrance  Stairs-Number of Steps: 4 Home Layout: One level Home Equipment: Walker - 2 wheels;Cane - single point;Shower seat - built in;Wheelchair - manual(lift chair)      Prior Function Level of Independence: Independent with assistive device(s)         Comments: Occational use of SPC; No falls history in past 6 months, no falls; typically drives and independent with IADL      Hand Dominance   Dominant Hand: Right    Extremity/Trunk Assessment   Upper Extremity Assessment Upper Extremity Assessment: Overall WFL for tasks assessed    Lower Extremity Assessment Lower Extremity Assessment: Overall WFL for tasks assessed    Cervical / Trunk Assessment Cervical / Trunk Assessment: Normal  Communication   Communication: No difficulties  Cognition Arousal/Alertness: Awake/alert Behavior During Therapy: WFL for tasks assessed/performed Overall Cognitive Status: Within Functional Limits for tasks assessed                                        General Comments      Exercises     Assessment/Plan    PT Assessment Patent does not need any further PT services  PT Problem List  PT Treatment Interventions      PT Goals (Current goals can be found in the Care Plan section)  Acute Rehab PT Goals PT Goal Formulation: All assessment and education complete, DC therapy    Frequency     Barriers to discharge        Co-evaluation               AM-PAC PT "6 Clicks" Mobility  Outcome Measure Help needed turning from your back to your side while in a flat bed without using bedrails?: None Help needed moving from lying on your back to sitting on the side of a flat bed without using bedrails?: None Help needed moving to and from a bed to a chair (including a wheelchair)?: None Help needed standing up from a chair using your arms (e.g., wheelchair or bedside chair)?: None Help needed to walk in hospital room?: None Help needed climbing 3-5 steps with a  railing? : None 6 Click Score: 24    End of Session Equipment Utilized During Treatment: Gait belt Activity Tolerance: Patient tolerated treatment well;No increased pain Patient left: in chair;with call bell/phone within reach Nurse Communication: Mobility status PT Visit Diagnosis: Difficulty in walking, not elsewhere classified (R26.2)    Time: 0263-7858 PT Time Calculation (min) (ACUTE ONLY): 27 min   Charges:   PT Evaluation $PT Eval Low Complexity: 1 Low PT Treatments $Therapeutic Exercise: 8-22 mins        2:07 PM, 07/31/18 Rosamaria Lints, PT, DPT Physical Therapist - [facility]  [phone] (ASCOM)    Buccola,Allan C 07/31/2018, 2:03 PM

## 2018-08-01 ENCOUNTER — Inpatient Hospital Stay
Admit: 2018-08-01 | Discharge: 2018-08-01 | Disposition: A | Discharge status: Home / Self Care | Payer: Medicare Other | Attending: Pulmonary Disease | Admitting: Pulmonary Disease

## 2018-08-01 ENCOUNTER — Inpatient Hospital Stay: Payer: Medicare Other

## 2018-08-01 LAB — ECHOCARDIOGRAM COMPLETE
Height: 66 in
Weight: 2733.7 oz

## 2018-08-01 LAB — CBC WITH DIFFERENTIAL/PLATELET
Abs Immature Granulocytes: 0.03 10*3/uL (ref 0.00–0.07)
Basophils Absolute: 0.1 10*3/uL (ref 0.0–0.1)
Basophils Relative: 1 %
Eosinophils Absolute: 0.4 10*3/uL (ref 0.0–0.5)
Eosinophils Relative: 4 %
HCT: 41.5 % (ref 36.0–46.0)
Hemoglobin: 13.3 g/dL (ref 12.0–15.0)
Immature Granulocytes: 0 %
Lymphocytes Relative: 18 %
Lymphs Abs: 1.8 10*3/uL (ref 0.7–4.0)
MCH: 27.4 pg (ref 26.0–34.0)
MCHC: 32 g/dL (ref 30.0–36.0)
MCV: 85.4 fL (ref 80.0–100.0)
Monocytes Absolute: 0.6 10*3/uL (ref 0.1–1.0)
Monocytes Relative: 7 %
Neutro Abs: 6.9 10*3/uL (ref 1.7–7.7)
Neutrophils Relative %: 70 %
Platelets: 277 10*3/uL (ref 150–400)
RBC: 4.86 MIL/uL (ref 3.87–5.11)
RDW: 13.5 % (ref 11.5–15.5)
WBC: 9.9 10*3/uL (ref 4.0–10.5)
nRBC: 0 % (ref 0.0–0.2)

## 2018-08-01 MED ORDER — FUROSEMIDE 40 MG PO TABS: 40.0000 mg | ORAL_TABLET | Freq: Two times a day (BID) | ORAL | 11 refills | Status: DC

## 2018-08-01 MED ORDER — AZITHROMYCIN 500 MG PO TABS: 500.0000 mg | ORAL_TABLET | Freq: Every day | ORAL | 0 refills | Status: AC

## 2018-08-01 MED ORDER — IOHEXOL 300 MG/ML  SOLN
100.0000 mL | Freq: Once | INTRAMUSCULAR | Status: AC | PRN
  Administered 2018-08-01: 16:00:00 100 mL via INTRAVENOUS

## 2018-08-01 MED ORDER — CEFUROXIME AXETIL 500 MG PO TABS: 500.0000 mg | ORAL_TABLET | Freq: Two times a day (BID) | ORAL | 0 refills | Status: AC

## 2018-08-01 MED ORDER — IOHEXOL 240 MG/ML SOLN
25.0000 mL | INTRAMUSCULAR | Status: AC
  Administered 2018-08-01 (×2): 25 mL via ORAL

## 2018-08-01 MED ORDER — ALPRAZOLAM 0.5 MG PO TABS
0.5000 mg | ORAL_TABLET | Freq: Two times a day (BID) | ORAL | Status: DC | PRN
  Administered 2018-08-01: 13:00:00 0.5 mg via ORAL
  Filled 2018-08-01: qty 1

## 2018-08-01 NOTE — Discharge Summary (Signed)
[facility] - [facility], 85 y.o., DOB [birth year], MRN [id number]. Admission date: 07/30/2018 Discharge Date 08/01/2018 Primary MD Patient, No Pcp Per Admitting Physician Arnaldo Natal, MD  Admission Diagnosis  Pleural effusion [J90] Acute respiratory failure with hypoxia [facility]) [J96.01]  Discharge Diagnosis   Active Problems: Acute respiratory failure due to transudate pleural effusions Bilateral atelectasis Possible community-acquired pneumonia Sepsis ruled out Chronic atrial fibrillation Hyperlipidemia   Hospital Course  Patient is 85 year old white female who presented with progressive shortness of breath over the past few months.  Patient symptoms have gotten worse.  In the ER she had a CT scan which showed no pulmonary embolism.  Some concern for pneumonia as well as large right-sided pleural effusion.  Patient underwent thoracentesis.  Postprocedure as her symptoms improved significantly.  She was seen by pulmonary during hospitalization.  The fluid was transudate in nature.  Differential included possible congestive heart failure.  Patient's previous echo showed preserved EF in the past.  Repeat echo was done results are currently pending.  This needs to be followed up by her primary care provider.  Patient also on a CT scan showed possible cirrhosis nodular liver.  Therefore she is getting ultrasound of her liver.  Patient is doing much better.    To note during hospitalization DNR was discussed patient wants to be a DNR      Consults  pulmonary/intensive care  Significant Tests:  See full reports for all details     Dg Chest 1 View  Result Date: 07/30/2018 CLINICAL DATA:  RIGHT thoracentesis EXAM: CHEST  1 VIEW COMPARISON:  Radiograph 07/30/2018 is FINDINGS: Normal cardiac silhouette. Considerable reduction in RIGHT pleural effusion following thoracentesis. Pneumothorax identified. IMPRESSION: Reduction in RIGHT pleural fluid  following thoracentesis. No pneumothorax identified. Electronically Signed   By: Genevive Bi M.D.   On: 07/30/2018 15:48   Ct Angio Chest Pe W And/or Wo Contrast  Result Date: 07/30/2018 CLINICAL DATA:  Chest pain, shortness of breath x3 days EXAM: CT ANGIOGRAPHY CHEST WITH CONTRAST TECHNIQUE: Multidetector CT imaging of the chest was performed using the standard protocol during bolus administration of intravenous contrast. Multiplanar CT image reconstructions and MIPs were obtained to evaluate the vascular anatomy. CONTRAST:  75mL OMNIPAQUE IOHEXOL 350 MG/ML SOLN COMPARISON:  Chest radiograph dated 07/30/2018 FINDINGS: Cardiovascular: Satisfactory opacification of the bilateral pulmonary arteries to the segmental level. No evidence of pulmonary embolism. Heart is normal in size.  No pericardial effusion. Mild atherosclerotic calcifications of the aortic arch. Enlargement of the main pulmonary artery, suggesting pulmonary arterial hypertension. Three vessel coronary atherosclerosis. Mediastinum/Nodes: Suspected 9 mm short axis subcarinal node (series 5/image 39), within normal limits. No suspicious mediastinal lymphadenopathy. Visualized thyroid is unremarkable. Lungs/Pleura: Large right pleural effusion. Associated right upper, middle, and lower lobe compressive atelectasis. Mild interlobular septal thickening in the right upper lobe may reflect asymmetric edema (series 7/image 24), possibly related to lung re-expansion in the setting of the effusion. Small left pleural effusion.  Mild left basilar atelectasis. No suspicious pulmonary nodules, noting poor visualization of the right lung. No pneumothorax. Upper Abdomen: Nodular hepatic contour, raising the possibility of cirrhosis. Calcified hepatic and splenic granulomata. Vascular calcifications. Musculoskeletal: Mild degenerative changes of the visualized thoracolumbar spine. Review of the MIP images confirms the above findings. IMPRESSION: No evidence  of pulmonary embolism. Enlargement of the main pulmonary artery, suggesting pulmonary arterial hypertension. Large right pleural effusion with associated right lung atelectasis. Small left pleural effusion. Nodular hepatic contour,  suggesting cirrhosis. Aortic Atherosclerosis (ICD10-I70.0). Electronically Signed   By: Charline Bills M.D.   On: 07/30/2018 04:06   Dg Chest Portable 1 View  Result Date: 07/30/2018 CLINICAL DATA:  Chest pain EXAM: PORTABLE CHEST 1 VIEW COMPARISON:  None. FINDINGS: Right greater than left pleural effusions. Obscured cardiomediastinal silhouette. Vascular congestion. Diffuse opacity in the right thorax, likely combination of layering effusion and underlying edema. Focal consolidations in the right upper lobe and bilateral lung bases. Aortic atherosclerosis. No pneumothorax. IMPRESSION: 1. Cardiomegaly with vascular congestion and mild interstitial edema. 2. Right greater than left pleural effusions. Diffuse hazy asymmetric opacity in the right thorax may be due to combination of layering effusion and underlying edema 3. Multifocal consolidations in the right upper lobe and bilateral lung bases may reflect pneumonia Electronically Signed   By: Jasmine Pang M.D.   On: 07/30/2018 01:39       Today   Subjective:   Lisa Moon patient doing much better shortness of breath resolved Objective:   Blood pressure 129/74, pulse 85, temperature 97.7 F (36.5 C), temperature source Oral, resp. rate 18, height  (1.676 m), weight 77.5 kg, SpO2 100 %.  .  Intake/Output Summary (Last 24 hours) at 08/01/2018 1220 Last data filed at 08/01/2018 1039 Gross per 24 hour  Intake 275.7 ml  Output 3400 ml  Net -3124.3 ml    Exam VITAL SIGNS: Blood pressure 129/74, pulse 85, temperature 97.7 F (36.5 C), temperature source Oral, resp. rate 18, height  (1.676 m), weight 77.5 kg, SpO2 100 %.  GENERAL:  85 y.o.-year-old patient lying in the bed with no acute distress.  EYES:  Pupils equal, round, reactive to light and accommodation. No scleral icterus. Extraocular muscles intact.  HEENT: Head atraumatic, normocephalic. Oropharynx and nasopharynx clear.  NECK:  Supple, no jugular venous distention. No thyroid enlargement, no tenderness.  LUNGS: Normal breath sounds bilaterally, no wheezing, rales,rhonchi or crepitation. No use of accessory muscles of respiration.  CARDIOVASCULAR: S1, S2 normal. No murmurs, rubs, or gallops.  ABDOMEN: Soft, nontender, nondistended. Bowel sounds present. No organomegaly or mass.  EXTREMITIES: No pedal edema, cyanosis, or clubbing.  NEUROLOGIC: Cranial nerves II through XII are intact. Muscle strength 5/5 in all extremities. Sensation intact. Gait not checked.  PSYCHIATRIC: The patient is alert and oriented x 3.  SKIN: No obvious rash, lesion, or ulcer.   Data Review     CBC w Diff:  Lab Results  Component Value Date   WBC 9.9 08/01/2018   HGB 13.3 08/01/2018   HCT 41.5 08/01/2018   PLT 277 08/01/2018   LYMPHOPCT 18 08/01/2018   MONOPCT 7 08/01/2018   EOSPCT 4 08/01/2018   BASOPCT 1 08/01/2018   CMP:  Lab Results  Component Value Date   NA 136 07/31/2018   K 4.1 07/31/2018   CL 102 07/31/2018   CO2 26 07/31/2018   BUN 19 07/31/2018   CREATININE 0.79 07/31/2018   PROT 6.3 (L) 07/31/2018   ALBUMIN 3.0 (L) 07/31/2018   BILITOT 0.6 07/31/2018   ALKPHOS 91 07/31/2018   AST 25 07/31/2018   ALT 26 07/31/2018  .  Micro Results Recent Results (from the past 240 hour(s))  SARS Coronavirus 2 (CEPHEID- Performed in [facility] hospital lab), Hosp Order     Status: None   Collection Time: 07/30/18  1:33 AM  Result Value Ref Range Status   SARS Coronavirus 2 NEGATIVE NEGATIVE Final    Comment: (NOTE) If result is NEGATIVE SARS-CoV-2 target  nucleic acids are NOT DETECTED. The SARS-CoV-2 RNA is generally detectable in upper and lower  respiratory specimens during the acute phase of infection. The lowest  concentration  of SARS-CoV-2 viral copies this assay can detect is 250  copies / mL. A negative result does not preclude SARS-CoV-2 infection  and should not be used as the sole basis for treatment or other  patient management decisions.  A negative result may occur with  improper specimen collection / handling, submission of specimen other  than nasopharyngeal swab, presence of viral mutation(s) within the  areas targeted by this assay, and inadequate number of viral copies  (<250 copies / mL). A negative result must be combined with clinical  observations, patient history, and epidemiological information. If result is POSITIVE SARS-CoV-2 target nucleic acids are DETECTED. The SARS-CoV-2 RNA is generally detectable in upper and lower  respiratory specimens dur ing the acute phase of infection.  Positive  results are indicative of active infection with SARS-CoV-2.  Clinical  correlation with patient history and other diagnostic information is  necessary to determine patient infection status.  Positive results do  not rule out bacterial infection or co-infection with other viruses. If result is PRESUMPTIVE POSTIVE SARS-CoV-2 nucleic acids MAY BE PRESENT.   A presumptive positive result was obtained on the submitted specimen  and confirmed on repeat testing.  While 2019 novel coronavirus  (SARS-CoV-2) nucleic acids may be present in the submitted sample  additional confirmatory testing may be necessary for epidemiological  and / or clinical management purposes  to differentiate between  SARS-CoV-2 and other Sarbecovirus currently known to infect humans.  If clinically indicated additional testing with an alternate test  methodology [phone]) is advised. The SARS-CoV-2 RNA is generally  detectable in upper and lower respiratory sp ecimens during the acute  phase of infection. The expected result is Negative. Fact Sheet for Patients:  BoilerBrush.com.cy Fact Sheet for Healthcare  Providers: https://pope.com/ This test is not yet approved or cleared by the [place] FDA and has been authorized for detection and/or diagnosis of SARS-CoV-2 by FDA under an Emergency Use Authorization (EUA).  This EUA will remain in effect (meaning this test can be used) for the duration of the COVID-19 declaration under Section 564(b)(1) of the Act, 21 U.S.C. section 360bbb-3(b)(1), unless the authorization is terminated or revoked sooner. Performed at [facility], [address] [place], [place] [address]   Blood Culture (routine x 2)     Status: None (Preliminary result)   Collection Time: 07/30/18  2:13 AM  Result Value Ref Range Status   Specimen Description BLOOD LEFT HAND  Final   Special Requests   Final    BOTTLES DRAWN AEROBIC AND ANAEROBIC Blood Culture adequate volume   Culture   Final    NO GROWTH 2 DAYS Performed at [facility], [address], [place], [place] [address]    Report Status PENDING  Incomplete  Blood Culture (routine x 2)     Status: None (Preliminary result)   Collection Time: 07/30/18  2:13 AM  Result Value Ref Range Status   Specimen Description BLOOD LEFT ANTECUBITAL  Final   Special Requests   Final    BOTTLES DRAWN AEROBIC AND ANAEROBIC Blood Culture results may not be optimal due to an excessive volume of blood received in culture bottles   Culture   Final    NO GROWTH 2 DAYS Performed at [facility], [address], [place], [place] [address]  Report Status PENDING  Incomplete  Culture, body fluid-bottle     Status: None (Preliminary result)   Collection Time: 07/30/18  2:42 PM  Result Value Ref Range Status   Specimen Description FLUID PLEURAL  Final   Special Requests   Final    BOTTLES DRAWN AEROBIC AND ANAEROBIC Blood Culture adequate volume   Culture   Final    NO GROWTH 2 DAYS Performed at [facility], [address] [place], [place] [address]    Report  Status PENDING  Incomplete  Gram stain     Status: None   Collection Time: 07/30/18  2:42 PM  Result Value Ref Range Status   Specimen Description FLUID PLEURAL  Final   Special Requests NONE  Final   Gram Stain   Final    FEW WBC PRESENT, PREDOMINANTLY MONONUCLEAR NO ORGANISMS SEEN Performed at [facility], [address] [place], [place] [address]    Report Status 07/31/2018 FINAL  Final        Code Status Orders  (From admission, onward)         Start     Ordered   07/30/18 1354  Do not attempt resuscitation (DNR)  Continuous    Question Answer Comment  In the event of cardiac or respiratory ARREST Do not call a "code blue"   In the event of cardiac or respiratory ARREST Do not perform Intubation, CPR, defibrillation or ACLS   In the event of cardiac or respiratory ARREST Use medication by any route, position, wound care, and other measures to relive pain and suffering. May use oxygen, suction and manual treatment of airway obstruction as needed for comfort.      07/30/18 1354        Code Status History    Date Active Date Inactive Code Status Order ID Comments User Context   07/30/2018 0848 07/30/2018 1354 Full Code [id number]  Arnaldo Nataliamond, Michael S, MD Inpatient    Advance Directive Documentation     Most Recent Value  Type of Advance Directive  Living will  Pre-existing out of facility DNR order (yellow form or pink MOST form)  -  "MOST" Form in Place?  -            Discharge Medications   Allergies as of 08/01/2018      Reactions   Sulfa Antibiotics    Penicillins Rash      Medication List    TAKE these medications   aspirin EC 81 MG tablet Take 81 mg by mouth daily.   azithromycin 500 MG tablet Commonly known as:  Zithromax Take 1 tablet (500 mg total) by mouth daily for 3 days. Take 1 tablet daily for 3 days.   CALTRATE 600+D PO Take 1 tablet by mouth 2 (two) times daily with a meal.   cefUROXime 500 MG tablet Commonly known as:   CEFTIN Take 1 tablet (500 mg total) by mouth 2 (two) times daily for 5 days.   furosemide 40 MG tablet Commonly known as:  Lasix Take 1 tablet (40 mg total) by mouth 2 (two) times daily.   lovastatin 40 MG tablet Commonly known as:  MEVACOR Take 40 mg by mouth at bedtime.   metoprolol tartrate 50 MG tablet Commonly known as:  LOPRESSOR Take 50 mg by mouth 2 (two) times daily.   MULTIVITAMIN ADULT PO Take 1 tablet by mouth daily.          Total Time in preparing paper  work, Patent examiner and todays exam - 35 minutes  Auburn Bilberry M.D on 08/01/2018 at 12:20 PM [facility]   Office  [phone]

## 2018-08-01 NOTE — Progress Notes (Signed)
Pulmonary Medicine          Date: 08/01/2018,   MRN# [id number] Lisa Moon 1932-10-12     AdmissionWeight: 77.1 kg                 CurrentWeight: 77.5 kg       SUBJECTIVE   Patient is clinically improved, reports no shortness of breath.  Patient has severe anxiety and had episode of panic attack overnight.  She responded well to Ativan I had ordered Xanax 0.5 mg p.o. twice daily as needed while inpatient and had counseled her to speak with primary care Dr. Graciela Husbands regarding optimal treatment of her underlying anxiety.  Plan is for discharge today with close follow-up with pulmonary on outpatient basis.   PAST MEDICAL HISTORY   Past Medical History:  Diagnosis Date   A-fib (HCC)    HLD (hyperlipidemia)    Hypertension      SURGICAL HISTORY   Past Surgical History:  Procedure Laterality Date   ABDOMINAL HYSTERECTOMY     CARDIAC ELECTROPHYSIOLOGY MAPPING AND ABLATION       FAMILY HISTORY   Family History  Problem Relation Age of Onset   Liver cancer Mother    Heart attack Mother    CAD Father      SOCIAL HISTORY   Social History   Tobacco Use   Smoking status: Never Smoker   Smokeless tobacco: Never Used  Substance Use Topics   Alcohol use: Not on file   Drug use: Not on file     MEDICATIONS    Home Medication:  Current Outpatient Rx   Order #: [id number] Class: Print   Order #: [id number] Class: Print   Order #: [id number] Class: Print    Current Medication:  Current Facility-Administered Medications:    0.9 %  sodium chloride infusion, , Intravenous, PRN, Auburn Bilberry, MD, Stopped at 07/31/18 2337   acetaminophen (TYLENOL) tablet 650 mg, 650 mg, Oral, Q6H PRN, 650 mg at 07/31/18 0606 **OR** acetaminophen (TYLENOL) suppository 650 mg, 650 mg, Rectal, Q6H PRN, Arnaldo Natal, MD   ALPRAZolam Prudy Feeler) tablet 0.5 mg, 0.5 mg, Oral, BID PRN, Vida Rigger, MD, 0.5 mg at 08/01/18 1235   aspirin EC tablet 81 mg,  81 mg, Oral, Daily, Arnaldo Natal, MD, 81 mg at 08/01/18 0945   azithromycin (ZITHROMAX) 500 mg in sodium chloride 0.9 % 250 mL IVPB, 500 mg, Intravenous, Q24H, Fernand Sorbello, MD, Last Rate: 250 mL/hr at 08/01/18 0953, 500 mg at 08/01/18 0953   cefTRIAXone (ROCEPHIN) 1 g in sodium chloride 0.9 % 100 mL IVPB, 1 g, Intravenous, Q24H, Arnaldo Natal, MD, Last Rate: 200 mL/hr at 08/01/18 1130, 1 g at 08/01/18 1130   docusate sodium (COLACE) capsule 100 mg, 100 mg, Oral, BID, Arnaldo Natal, MD, 100 mg at 08/01/18 0945   fluticasone (FLONASE) 50 MCG/ACT nasal spray 2 spray, 2 spray, Each Nare, Daily, Auburn Bilberry, MD, 2 spray at 08/01/18 0945   furosemide (LASIX) injection 40 mg, 40 mg, Intravenous, BID, Karna Christmas, Charlisa Cham, MD, 40 mg at 08/01/18 0945   metoprolol tartrate (LOPRESSOR) tablet 50 mg, 50 mg, Oral, BID, Arnaldo Natal, MD, 50 mg at 08/01/18 0945   multivitamin with minerals tablet 1 tablet, 1 tablet, Oral, Daily, Auburn Bilberry, MD, 1 tablet at 08/01/18 0945   ondansetron (ZOFRAN) tablet 4 mg, 4 mg, Oral, Q6H PRN **OR** ondansetron (ZOFRAN) injection 4 mg, 4 mg, Intravenous, Q6H PRN, Arnaldo Natal, MD   pravastatin (PRAVACHOL) tablet  20 mg, 20 mg, Oral, q1800, Auburn BilberryPatel, Shreyang, MD, 20 mg at 07/31/18 1750    ALLERGIES   Sulfa antibiotics and Penicillins     REVIEW OF SYSTEMS    Review of Systems:  Gen:  Denies  fever, sweats, chills weigh loss  HEENT: Denies blurred vision, double vision, ear pain, eye pain, hearing loss, nose bleeds, sore throat Cardiac:  No dizziness, chest pain or heaviness, chest tightness,edema Resp:   Denies cough or sputum porduction, shortness of breath,wheezing, hemoptysis,  Gi: Denies swallowing difficulty, stomach pain, nausea or vomiting, diarrhea, constipation, bowel incontinence Gu:  Denies bladder incontinence, burning urine Ext:   Denies Joint pain, stiffness or swelling Skin: Denies  skin rash, easy bruising or  bleeding or hives Endoc:  Denies polyuria, polydipsia , polyphagia or weight change Psych:   Denies depression, insomnia or hallucinations   Other:  All other systems negative   VS: BP 129/74 (BP Location: Right Arm)    Pulse 85    Temp 97.7 F (36.5 C) (Oral)    Resp 18    Ht 5\' 6"  (1.676 m)    Wt 77.5 kg    SpO2 100%    BMI 27.58 kg/m      PHYSICAL EXAM    GENERAL:NAD, no fevers, chills, no weakness no fatigue HEAD: Normocephalic, atraumatic.  EYES: Pupils equal, round, reactive to light. Extraocular muscles intact. No scleral icterus.  MOUTH: Moist mucosal membrane. Dentition intact. No abscess noted.  EAR, NOSE, THROAT: Clear without exudates. No external lesions.  NECK: Supple. No thyromegaly. No nodules. No JVD.  PULMONARY: Clear to auscultation with decreased breath sounds at the right lower lung zone CARDIOVASCULAR: S1 and S2. Regular rate and rhythm. No murmurs, rubs, or gallops. No edema. Pedal pulses 2+ bilaterally.  GASTROINTESTINAL: Soft, nontender, nondistended. No masses. Positive bowel sounds. No hepatosplenomegaly.  MUSCULOSKELETAL: No swelling, clubbing, or edema. Range of motion full in all extremities.  NEUROLOGIC: Cranial nerves II through XII are intact. No gross focal neurological deficits. Sensation intact. Reflexes intact.  SKIN: No ulceration, lesions, rashes, or cyanosis. Skin warm and dry. Turgor intact.  PSYCHIATRIC: Mood, affect within normal limits. The patient is awake, alert and oriented x 3. Insight, judgment intact.       IMAGING    Dg Chest 1 View  Result Date: 07/30/2018 CLINICAL DATA:  RIGHT thoracentesis EXAM: CHEST  1 VIEW COMPARISON:  Radiograph 07/30/2018 is FINDINGS: Normal cardiac silhouette. Considerable reduction in RIGHT pleural effusion following thoracentesis. Pneumothorax identified. IMPRESSION: Reduction in RIGHT pleural fluid following thoracentesis. No pneumothorax identified. Electronically Signed   By: Genevive BiStewart  Edmunds M.D.    On: 07/30/2018 15:48   Ct Angio Chest Pe W And/or Wo Contrast  Result Date: 07/30/2018 CLINICAL DATA:  Chest pain, shortness of breath x3 days EXAM: CT ANGIOGRAPHY CHEST WITH CONTRAST TECHNIQUE: Multidetector CT imaging of the chest was performed using the standard protocol during bolus administration of intravenous contrast. Multiplanar CT image reconstructions and MIPs were obtained to evaluate the vascular anatomy. CONTRAST:  75mL OMNIPAQUE IOHEXOL 350 MG/ML SOLN COMPARISON:  Chest radiograph dated 07/30/2018 FINDINGS: Cardiovascular: Satisfactory opacification of the bilateral pulmonary arteries to the segmental level. No evidence of pulmonary embolism. Heart is normal in size.  No pericardial effusion. Mild atherosclerotic calcifications of the aortic arch. Enlargement of the main pulmonary artery, suggesting pulmonary arterial hypertension. Three vessel coronary atherosclerosis. Mediastinum/Nodes: Suspected 9 mm short axis subcarinal node (series 5/image 39), within normal limits. No suspicious mediastinal lymphadenopathy. Visualized thyroid is  unremarkable. Lungs/Pleura: Large right pleural effusion. Associated right upper, middle, and lower lobe compressive atelectasis. Mild interlobular septal thickening in the right upper lobe may reflect asymmetric edema (series 7/image 24), possibly related to lung re-expansion in the setting of the effusion. Small left pleural effusion.  Mild left basilar atelectasis. No suspicious pulmonary nodules, noting poor visualization of the right lung. No pneumothorax. Upper Abdomen: Nodular hepatic contour, raising the possibility of cirrhosis. Calcified hepatic and splenic granulomata. Vascular calcifications. Musculoskeletal: Mild degenerative changes of the visualized thoracolumbar spine. Review of the MIP images confirms the above findings. IMPRESSION: No evidence of pulmonary embolism. Enlargement of the main pulmonary artery, suggesting pulmonary arterial  hypertension. Large right pleural effusion with associated right lung atelectasis. Small left pleural effusion. Nodular hepatic contour, suggesting cirrhosis. Aortic Atherosclerosis (ICD10-I70.0). Electronically Signed   By: Charline Bills M.D.   On: 07/30/2018 04:06   Dg Chest Portable 1 View  Result Date: 07/30/2018 CLINICAL DATA:  Chest pain EXAM: PORTABLE CHEST 1 VIEW COMPARISON:  None. FINDINGS: Right greater than left pleural effusions. Obscured cardiomediastinal silhouette. Vascular congestion. Diffuse opacity in the right thorax, likely combination of layering effusion and underlying edema. Focal consolidations in the right upper lobe and bilateral lung bases. Aortic atherosclerosis. No pneumothorax. IMPRESSION: 1. Cardiomegaly with vascular congestion and mild interstitial edema. 2. Right greater than left pleural effusions. Diffuse hazy asymmetric opacity in the right thorax may be due to combination of layering effusion and underlying edema 3. Multifocal consolidations in the right upper lobe and bilateral lung bases may reflect pneumonia Electronically Signed   By: Jasmine Pang M.D.   On: 07/30/2018 01:39      ASSESSMENT/PLAN   Acute hypoxemic respiratory failure -Currently requiring 2 L/min nasal cannula oxygen therapy at rest -This is likely due to compressive atelectasissecondaryto large pleural effusion worse on right -Patient does not have a history of congestive heart failure with most recent transthoracic echo(2019)showing ejection fraction 55% and moderate mitral regurgitation as well as aortic regurgitation.-repeating TTE today -BNP collected today is only mildly elevated at 167.0 -Differential includes possible malignant effusion versus exudate due to parapneumonic process versussimple transudative effusion. -US liver pending.  -Diuresed well now >4L net negative   Bilateral pleural effusions -Transudate of mononuclear fluid with negative Gram stain  and no organisms seen unlikely infection related -Cytology pending -We will follow-up on outpatient basis in pulmonary clinic     Bilateral atelectasis -Aggressive bronchopulmonary hygiene -Would favor not using albuterol or DuoNeb's due to atrial fibrillation and risk of rapid ventricular response -Incentive spirometer at bedside to be used multiple times each hour please encourage patient to use -please send home with Acapella and IS    Possible community-acquired pneumonia -Currently on Rocephin will add Zithromax as well plan for 5 to 7-day course     Thank you for allowing me to participate in the care of this patient.   Patient/Family are satisfied with care plan and all questions have been answered.  This document was prepared using Dragon voice recognition software and may include unintentional dictation errors.      Vida Rigger, M.D.  Division of Pulmonary & Critical Care Medicine  [facility]

## 2018-08-01 NOTE — Progress Notes (Signed)
Pt discharged per MD order. IV removed. Discharge instructions reviewed with pt. Pt verbalized understanding with all questions answered to pt satisfaction. Pt taken downstairs in wheelchair by staff.  

## 2018-08-01 NOTE — Progress Notes (Signed)
*  PRELIMINARY RESULTS* Echocardiogram 2D Echocardiogram has been performed.  Lisa Moon 08/01/2018, 9:40 AM

## 2018-08-03 LAB — PH, BODY FLUID: pH, Body Fluid: 7.8

## 2018-08-03 LAB — CYTOLOGY - NON PAP

## 2018-08-04 LAB — CULTURE, BODY FLUID W GRAM STAIN -BOTTLE
Culture: NO GROWTH
Special Requests: ADEQUATE

## 2018-08-04 LAB — CULTURE, BLOOD (ROUTINE X 2)
Culture: NO GROWTH
Culture: NO GROWTH
Special Requests: ADEQUATE

## 2020-02-15 ENCOUNTER — Other Ambulatory Visit: Payer: Self-pay

## 2020-02-15 ENCOUNTER — Emergency Department
Admission: EM | Admit: 2020-02-15 | Discharge: 2020-02-15 | Disposition: A | Discharge status: Home / Self Care | Payer: Medicare PPO | Source: Home / Self Care

## 2020-02-15 ENCOUNTER — Encounter: Payer: Self-pay | Admitting: *Deleted

## 2020-02-15 DIAGNOSIS — M549 Dorsalgia, unspecified: Secondary | ICD-10-CM | POA: Insufficient documentation

## 2020-02-15 DIAGNOSIS — R001 Bradycardia, unspecified: Secondary | ICD-10-CM | POA: Diagnosis not present

## 2020-02-15 DIAGNOSIS — R112 Nausea with vomiting, unspecified: Secondary | ICD-10-CM | POA: Insufficient documentation

## 2020-02-15 DIAGNOSIS — A419 Sepsis, unspecified organism: Secondary | ICD-10-CM | POA: Diagnosis not present

## 2020-02-15 DIAGNOSIS — R6883 Chills (without fever): Secondary | ICD-10-CM | POA: Insufficient documentation

## 2020-02-15 DIAGNOSIS — R61 Generalized hyperhidrosis: Secondary | ICD-10-CM | POA: Insufficient documentation

## 2020-02-15 DIAGNOSIS — R197 Diarrhea, unspecified: Secondary | ICD-10-CM | POA: Insufficient documentation

## 2020-02-15 DIAGNOSIS — Z5321 Procedure and treatment not carried out due to patient leaving prior to being seen by health care provider: Secondary | ICD-10-CM | POA: Insufficient documentation

## 2020-02-15 LAB — COMPREHENSIVE METABOLIC PANEL
ALT: 35 U/L (ref 0–44)
AST: 40 U/L (ref 15–41)
Albumin: 3.6 g/dL (ref 3.5–5.0)
Alkaline Phosphatase: 88 U/L (ref 38–126)
Anion gap: 10 (ref 5–15)
BUN: 17 mg/dL (ref 8–23)
CO2: 22 mmol/L (ref 22–32)
Calcium: 8.9 mg/dL (ref 8.9–10.3)
Chloride: 97 mmol/L — ABNORMAL LOW (ref 98–111)
Creatinine, Ser: 0.64 mg/dL (ref 0.44–1.00)
GFR, Estimated: >60 mL/min (ref >60)
Glucose, Bld: 217 mg/dL — ABNORMAL HIGH (ref 70–99)
Potassium: 4.5 mmol/L (ref 3.5–5.1)
Sodium: 129 mmol/L — ABNORMAL LOW (ref 135–145)
Total Bilirubin: 1.1 mg/dL (ref 0.3–1.2)
Total Protein: 7.8 g/dL (ref 6.5–8.1)

## 2020-02-15 LAB — CBC
HCT: 43.8 % (ref 36.0–46.0)
Hemoglobin: 14.8 g/dL (ref 12.0–15.0)
MCH: 28.6 pg (ref 26.0–34.0)
MCHC: 33.8 g/dL (ref 30.0–36.0)
MCV: 84.6 fL (ref 80.0–100.0)
Platelets: 240 10*3/uL (ref 150–400)
RBC: 5.18 MIL/uL — ABNORMAL HIGH (ref 3.87–5.11)
RDW: 13.9 % (ref 11.5–15.5)
WBC: 12.7 10*3/uL — ABNORMAL HIGH (ref 4.0–10.5)
nRBC: 0 % (ref 0.0–0.2)

## 2020-02-15 LAB — LIPASE, BLOOD: Lipase: 20 U/L (ref 11–51)

## 2020-02-15 MED ORDER — ONDANSETRON HCL 4 MG/2ML IJ SOLN
4.0000 mg | Freq: Once | INTRAMUSCULAR | Status: AC | PRN
  Administered 2020-02-15: 4 mg via INTRAVENOUS
  Filled 2020-02-15: qty 2

## 2020-02-15 NOTE — ED Triage Notes (Signed)
Pt to ED reporting she woke last night diaphoretic with chills. Today pt has back pain and nausea. Vomiting since 1600 with diarrhea. No fevers. Pt denies dysuria. No exposure to others with similar symptoms.

## 2020-02-15 NOTE — ED Triage Notes (Signed)
EMS brings pt in from home; st awoke this am with N/V

## 2020-02-16 ENCOUNTER — Other Ambulatory Visit
Admission: RE | Admit: 2020-02-16 | Discharge: 2020-02-16 | Disposition: A | Discharge status: Home / Self Care | Payer: Medicare PPO | Source: Ambulatory Visit | Attending: Internal Medicine | Admitting: Internal Medicine

## 2020-02-16 DIAGNOSIS — I4891 Unspecified atrial fibrillation: Secondary | ICD-10-CM | POA: Insufficient documentation

## 2020-02-16 LAB — TROPONIN I (HIGH SENSITIVITY): Troponin I (High Sensitivity): 32 ng/L — ABNORMAL HIGH (ref <18)

## 2020-02-18 ENCOUNTER — Encounter: Payer: Self-pay | Admitting: Emergency Medicine

## 2020-02-18 ENCOUNTER — Inpatient Hospital Stay
Admission: EM | Admit: 2020-02-18 | Discharge: 2020-02-24 | DRG: 871 | Disposition: A | Discharge status: Home Health | Payer: Medicare PPO | Attending: Internal Medicine | Admitting: Internal Medicine

## 2020-02-18 ENCOUNTER — Emergency Department: Payer: Medicare PPO

## 2020-02-18 DIAGNOSIS — Z880 Allergy status to penicillin: Secondary | ICD-10-CM

## 2020-02-18 DIAGNOSIS — R001 Bradycardia, unspecified: Secondary | ICD-10-CM | POA: Diagnosis present

## 2020-02-18 DIAGNOSIS — I129 Hypertensive chronic kidney disease with stage 1 through stage 4 chronic kidney disease, or unspecified chronic kidney disease: Secondary | ICD-10-CM | POA: Diagnosis present

## 2020-02-18 DIAGNOSIS — Z8249 Family history of ischemic heart disease and other diseases of the circulatory system: Secondary | ICD-10-CM | POA: Diagnosis not present

## 2020-02-18 DIAGNOSIS — K819 Cholecystitis, unspecified: Secondary | ICD-10-CM

## 2020-02-18 DIAGNOSIS — R6521 Severe sepsis with septic shock: Secondary | ICD-10-CM | POA: Diagnosis present

## 2020-02-18 DIAGNOSIS — Z7982 Long term (current) use of aspirin: Secondary | ICD-10-CM

## 2020-02-18 DIAGNOSIS — Z90710 Acquired absence of both cervix and uterus: Secondary | ICD-10-CM | POA: Diagnosis not present

## 2020-02-18 DIAGNOSIS — J90 Pleural effusion, not elsewhere classified: Secondary | ICD-10-CM | POA: Diagnosis present

## 2020-02-18 DIAGNOSIS — Z800 Family history of malignant neoplasm of digestive organs: Secondary | ICD-10-CM | POA: Diagnosis not present

## 2020-02-18 DIAGNOSIS — K8000 Calculus of gallbladder with acute cholecystitis without obstruction: Secondary | ICD-10-CM | POA: Diagnosis present

## 2020-02-18 DIAGNOSIS — R002 Palpitations: Secondary | ICD-10-CM | POA: Diagnosis present

## 2020-02-18 DIAGNOSIS — E875 Hyperkalemia: Secondary | ICD-10-CM | POA: Diagnosis present

## 2020-02-18 DIAGNOSIS — Z6825 Body mass index (BMI) 25.0-25.9, adult: Secondary | ICD-10-CM | POA: Diagnosis not present

## 2020-02-18 DIAGNOSIS — R1011 Right upper quadrant pain: Secondary | ICD-10-CM

## 2020-02-18 DIAGNOSIS — Z888 Allergy status to other drugs, medicaments and biological substances status: Secondary | ICD-10-CM | POA: Diagnosis not present

## 2020-02-18 DIAGNOSIS — K810 Acute cholecystitis: Secondary | ICD-10-CM

## 2020-02-18 DIAGNOSIS — R627 Adult failure to thrive: Secondary | ICD-10-CM | POA: Diagnosis present

## 2020-02-18 DIAGNOSIS — I248 Other forms of acute ischemic heart disease: Secondary | ICD-10-CM | POA: Diagnosis present

## 2020-02-18 DIAGNOSIS — E785 Hyperlipidemia, unspecified: Secondary | ICD-10-CM | POA: Diagnosis present

## 2020-02-18 DIAGNOSIS — Z79899 Other long term (current) drug therapy: Secondary | ICD-10-CM | POA: Diagnosis not present

## 2020-02-18 DIAGNOSIS — N189 Chronic kidney disease, unspecified: Secondary | ICD-10-CM | POA: Diagnosis present

## 2020-02-18 DIAGNOSIS — E871 Hypo-osmolality and hyponatremia: Secondary | ICD-10-CM | POA: Diagnosis present

## 2020-02-18 DIAGNOSIS — R933 Abnormal findings on diagnostic imaging of other parts of digestive tract: Secondary | ICD-10-CM | POA: Diagnosis not present

## 2020-02-18 DIAGNOSIS — N179 Acute kidney failure, unspecified: Secondary | ICD-10-CM | POA: Diagnosis present

## 2020-02-18 DIAGNOSIS — K5730 Diverticulosis of large intestine without perforation or abscess without bleeding: Secondary | ICD-10-CM | POA: Diagnosis present

## 2020-02-18 DIAGNOSIS — R778 Other specified abnormalities of plasma proteins: Secondary | ICD-10-CM | POA: Diagnosis present

## 2020-02-18 DIAGNOSIS — Z20822 Contact with and (suspected) exposure to covid-19: Secondary | ICD-10-CM | POA: Diagnosis present

## 2020-02-18 DIAGNOSIS — I4891 Unspecified atrial fibrillation: Secondary | ICD-10-CM | POA: Diagnosis present

## 2020-02-18 DIAGNOSIS — I10 Essential (primary) hypertension: Secondary | ICD-10-CM | POA: Diagnosis present

## 2020-02-18 DIAGNOSIS — R7989 Other specified abnormal findings of blood chemistry: Secondary | ICD-10-CM | POA: Diagnosis present

## 2020-02-18 DIAGNOSIS — E860 Dehydration: Secondary | ICD-10-CM | POA: Diagnosis present

## 2020-02-18 DIAGNOSIS — I083 Combined rheumatic disorders of mitral, aortic and tricuspid valves: Secondary | ICD-10-CM | POA: Diagnosis present

## 2020-02-18 DIAGNOSIS — K5900 Constipation, unspecified: Secondary | ICD-10-CM | POA: Diagnosis present

## 2020-02-18 DIAGNOSIS — R7401 Elevation of levels of liver transaminase levels: Secondary | ICD-10-CM | POA: Diagnosis not present

## 2020-02-18 DIAGNOSIS — A419 Sepsis, unspecified organism: Secondary | ICD-10-CM | POA: Diagnosis present

## 2020-02-18 DIAGNOSIS — I480 Paroxysmal atrial fibrillation: Secondary | ICD-10-CM | POA: Diagnosis present

## 2020-02-18 DIAGNOSIS — R1013 Epigastric pain: Secondary | ICD-10-CM

## 2020-02-18 DIAGNOSIS — R739 Hyperglycemia, unspecified: Secondary | ICD-10-CM | POA: Diagnosis present

## 2020-02-18 DIAGNOSIS — Z882 Allergy status to sulfonamides status: Secondary | ICD-10-CM | POA: Diagnosis not present

## 2020-02-18 DIAGNOSIS — Z9889 Other specified postprocedural states: Secondary | ICD-10-CM

## 2020-02-18 LAB — RESP PANEL BY RT-PCR (FLU A&B, COVID) ARPGX2
Influenza A by PCR: NEGATIVE
Influenza B by PCR: NEGATIVE
SARS Coronavirus 2 by RT PCR: NEGATIVE

## 2020-02-18 LAB — TROPONIN I (HIGH SENSITIVITY)
Troponin I (High Sensitivity): 22 ng/L — ABNORMAL HIGH (ref <18)
Troponin I (High Sensitivity): 20 ng/L — ABNORMAL HIGH (ref <18)
Troponin I (High Sensitivity): 22 ng/L — ABNORMAL HIGH (ref <18)
Troponin I (High Sensitivity): 24 ng/L — ABNORMAL HIGH (ref <18)

## 2020-02-18 LAB — BASIC METABOLIC PANEL
Anion gap: 10 (ref 5–15)
BUN: 23 mg/dL (ref 8–23)
CO2: 25 mmol/L (ref 22–32)
Calcium: 8.9 mg/dL (ref 8.9–10.3)
Chloride: 94 mmol/L — ABNORMAL LOW (ref 98–111)
Creatinine, Ser: 1 mg/dL (ref 0.44–1.00)
GFR, Estimated: 55 mL/min — ABNORMAL LOW (ref >60)
Glucose, Bld: 198 mg/dL — ABNORMAL HIGH (ref 70–99)
Potassium: 5.3 mmol/L — ABNORMAL HIGH (ref 3.5–5.1)
Sodium: 129 mmol/L — ABNORMAL LOW (ref 135–145)

## 2020-02-18 LAB — COMPREHENSIVE METABOLIC PANEL
ALT: 143 U/L — ABNORMAL HIGH (ref 0–44)
AST: 114 U/L — ABNORMAL HIGH (ref 15–41)
Albumin: 3.6 g/dL (ref 3.5–5.0)
Alkaline Phosphatase: 124 U/L (ref 38–126)
Anion gap: 14 (ref 5–15)
BUN: 25 mg/dL — ABNORMAL HIGH (ref 8–23)
CO2: 18 mmol/L — ABNORMAL LOW (ref 22–32)
Calcium: 8.9 mg/dL (ref 8.9–10.3)
Chloride: 94 mmol/L — ABNORMAL LOW (ref 98–111)
Creatinine, Ser: 1.05 mg/dL — ABNORMAL HIGH (ref 0.44–1.00)
GFR, Estimated: 52 mL/min — ABNORMAL LOW (ref >60)
Glucose, Bld: 282 mg/dL — ABNORMAL HIGH (ref 70–99)
Potassium: 5.2 mmol/L — ABNORMAL HIGH (ref 3.5–5.1)
Sodium: 126 mmol/L — ABNORMAL LOW (ref 135–145)
Total Bilirubin: 1.1 mg/dL (ref 0.3–1.2)
Total Protein: 7.6 g/dL (ref 6.5–8.1)

## 2020-02-18 LAB — CBC
HCT: 46 % (ref 36.0–46.0)
Hemoglobin: 15.1 g/dL — ABNORMAL HIGH (ref 12.0–15.0)
MCH: 28.1 pg (ref 26.0–34.0)
MCHC: 32.8 g/dL (ref 30.0–36.0)
MCV: 85.7 fL (ref 80.0–100.0)
Platelets: 248 10*3/uL (ref 150–400)
RBC: 5.37 MIL/uL — ABNORMAL HIGH (ref 3.87–5.11)
RDW: 14.1 % (ref 11.5–15.5)
WBC: 12.7 10*3/uL — ABNORMAL HIGH (ref 4.0–10.5)
nRBC: 0 % (ref 0.0–0.2)

## 2020-02-18 LAB — TSH: TSH: 2.258 u[IU]/mL (ref 0.350–4.500)

## 2020-02-18 LAB — OSMOLALITY: Osmolality: 291 mOsm/kg (ref 275–295)

## 2020-02-18 LAB — BRAIN NATRIURETIC PEPTIDE: B Natriuretic Peptide: 628.7 pg/mL — ABNORMAL HIGH (ref 0.0–100.0)

## 2020-02-18 LAB — LACTIC ACID, PLASMA
Lactic Acid, Venous: 4.5 mmol/L (ref 0.5–1.9)
Lactic Acid, Venous: 3.7 mmol/L (ref 0.5–1.9)

## 2020-02-18 LAB — APTT: aPTT: 29 seconds (ref 24–36)

## 2020-02-18 LAB — HEPARIN LEVEL (UNFRACTIONATED): Heparin Unfractionated: 0.67 IU/mL (ref 0.30–0.70)

## 2020-02-18 LAB — PROTIME-INR
INR: 1.1 (ref 0.8–1.2)
Prothrombin Time: 13.4 seconds (ref 11.4–15.2)

## 2020-02-18 LAB — LACTATE DEHYDROGENASE: LDH: 161 U/L (ref 98–192)

## 2020-02-18 LAB — PROCALCITONIN: Procalcitonin: <0.1 ng/mL

## 2020-02-18 LAB — MAGNESIUM: Magnesium: 2.1 mg/dL (ref 1.7–2.4)

## 2020-02-18 MED ORDER — METRONIDAZOLE 500 MG PO TABS
500.0000 mg | ORAL_TABLET | Freq: Three times a day (TID) | ORAL | Status: DC
  Administered 2020-02-18 – 2020-02-22 (×11): 500 mg via ORAL
  Filled 2020-02-18 (×13): qty 1

## 2020-02-18 MED ORDER — SODIUM ZIRCONIUM CYCLOSILICATE 10 G PO PACK
10.0000 g | PACK | Freq: Once | ORAL | Status: AC
  Administered 2020-02-18: 17:00:00 10 g via ORAL
  Filled 2020-02-18: qty 1

## 2020-02-18 MED ORDER — SODIUM CHLORIDE 0.9 % IV BOLUS
250.0000 mL | Freq: Once | INTRAVENOUS | Status: AC
  Administered 2020-02-18: 12:00:00 250 mL via INTRAVENOUS

## 2020-02-18 MED ORDER — SODIUM CHLORIDE 0.9 % IV SOLN
2.0000 g | Freq: Two times a day (BID) | INTRAVENOUS | Status: DC
  Administered 2020-02-18 – 2020-02-20 (×4): 2 g via INTRAVENOUS
  Filled 2020-02-18 (×4): qty 2

## 2020-02-18 MED ORDER — SODIUM CHLORIDE 0.9 % IV SOLN
2.0000 g | Freq: Once | INTRAVENOUS | Status: AC
  Administered 2020-02-18: 11:00:00 2 g via INTRAVENOUS
  Filled 2020-02-18: qty 2

## 2020-02-18 MED ORDER — ATROPINE SULFATE 1 MG/10ML IJ SOSY: 0.0400 mg | PREFILLED_SYRINGE | Freq: Once | INTRAMUSCULAR | Status: DC

## 2020-02-18 MED ORDER — AMIODARONE LOAD VIA INFUSION
75.0000 mg | Freq: Once | INTRAVENOUS | Status: AC
  Administered 2020-02-18: 15:00:00 75 mg via INTRAVENOUS
  Filled 2020-02-18 (×2): qty 41.67

## 2020-02-18 MED ORDER — AMIODARONE HCL IN DEXTROSE 360-4.14 MG/200ML-% IV SOLN
60.0000 mg/h | INTRAVENOUS | Status: DC
  Administered 2020-02-18: 15:00:00 60 mg/h via INTRAVENOUS
  Filled 2020-02-18 (×2): qty 200

## 2020-02-18 MED ORDER — CALCIUM GLUCONATE-NACL 1-0.675 GM/50ML-% IV SOLN
1.0000 g | Freq: Once | INTRAVENOUS | Status: AC
  Administered 2020-02-18: 10:00:00 1000 mg via INTRAVENOUS
  Filled 2020-02-18: qty 50

## 2020-02-18 MED ORDER — ATROPINE SULFATE 1 MG/10ML IJ SOSY
PREFILLED_SYRINGE | INTRAMUSCULAR | Status: AC
  Administered 2020-02-18: 0.4 mg
  Filled 2020-02-18: qty 10

## 2020-02-18 MED ORDER — AMIODARONE HCL IN DEXTROSE 360-4.14 MG/200ML-% IV SOLN
30.0000 mg/h | INTRAVENOUS | Status: DC
  Administered 2020-02-18 – 2020-02-19 (×2): 30 mg/h via INTRAVENOUS
  Filled 2020-02-18 (×3): qty 200

## 2020-02-18 MED ORDER — HEPARIN (PORCINE) 25000 UT/250ML-% IV SOLN
1000.0000 [IU]/h | INTRAVENOUS | Status: DC
  Administered 2020-02-18: 12:00:00 1000 [IU]/h via INTRAVENOUS
  Filled 2020-02-18 (×2): qty 250

## 2020-02-18 MED ORDER — MORPHINE SULFATE (PF) 2 MG/ML IV SOLN
0.5000 mg | INTRAVENOUS | Status: DC | PRN
  Filled 2020-02-18: qty 1

## 2020-02-18 MED ORDER — EPINEPHRINE HCL 5 MG/250ML IV SOLN IN NS
0.5000 ug/min | INTRAVENOUS | Status: DC
  Administered 2020-02-18: 0.5 ug/min via INTRAVENOUS
  Filled 2020-02-18: qty 250

## 2020-02-18 MED ORDER — ONDANSETRON HCL 4 MG/2ML IJ SOLN
4.0000 mg | Freq: Three times a day (TID) | INTRAMUSCULAR | Status: DC | PRN
  Administered 2020-02-18 – 2020-02-22 (×4): 4 mg via INTRAVENOUS
  Filled 2020-02-18 (×5): qty 2

## 2020-02-18 MED ORDER — IOHEXOL 300 MG/ML  SOLN
80.0000 mL | Freq: Once | INTRAMUSCULAR | Status: AC | PRN
  Administered 2020-02-18: 11:00:00 80 mL via INTRAVENOUS

## 2020-02-18 MED ORDER — GLUCAGON HCL RDNA (DIAGNOSTIC) 1 MG IJ SOLR
1.0000 mg | Freq: Once | INTRAMUSCULAR | Status: AC
  Administered 2020-02-18: 12:00:00 1 mg via INTRAVENOUS
  Filled 2020-02-18: qty 1

## 2020-02-18 MED ORDER — SODIUM CHLORIDE 0.9 % IV SOLN: INTRAVENOUS | Status: DC

## 2020-02-18 MED ORDER — HEPARIN BOLUS VIA INFUSION
3800.0000 [IU] | Freq: Once | INTRAVENOUS | Status: AC
  Administered 2020-02-18: 12:00:00 3800 [IU] via INTRAVENOUS
  Filled 2020-02-18: qty 3800

## 2020-02-18 MED ORDER — SODIUM CHLORIDE 0.9 % IV BOLUS
1000.0000 mL | Freq: Once | INTRAVENOUS | Status: AC
  Administered 2020-02-18: 13:00:00 1000 mL via INTRAVENOUS

## 2020-02-18 MED ORDER — EPINEPHRINE 1 MG/10ML IJ SOSY
PREFILLED_SYRINGE | INTRAMUSCULAR | Status: AC
  Filled 2020-02-18: qty 10

## 2020-02-18 MED ORDER — ONDANSETRON HCL 4 MG/2ML IJ SOLN
4.0000 mg | Freq: Once | INTRAMUSCULAR | Status: AC
  Administered 2020-02-18: 11:00:00 4 mg via INTRAVENOUS
  Filled 2020-02-18: qty 2

## 2020-02-18 MED ORDER — INSULIN ASPART 100 UNIT/ML ~~LOC~~ SOLN
4.0000 [IU] | Freq: Once | SUBCUTANEOUS | Status: AC
  Administered 2020-02-18: 12:00:00 4 [IU] via INTRAVENOUS
  Filled 2020-02-18: qty 1

## 2020-02-18 MED ORDER — ATROPINE SULFATE 1 MG/10ML IJ SOSY
0.4000 mg | PREFILLED_SYRINGE | Freq: Once | INTRAMUSCULAR | Status: AC
  Administered 2020-02-18: 09:00:00 0.4 mg via INTRAVENOUS

## 2020-02-18 MED ORDER — GLUCAGON HCL (RDNA) 1 MG IJ SOLR
1.0000 mg | Freq: Once | INTRAMUSCULAR | Status: DC
  Filled 2020-02-18: qty 1

## 2020-02-18 MED ORDER — METRONIDAZOLE 500 MG PO TABS
500.0000 mg | ORAL_TABLET | Freq: Once | ORAL | Status: AC
  Administered 2020-02-18: 12:00:00 500 mg via ORAL
  Filled 2020-02-18: qty 1

## 2020-02-18 MED ORDER — ALBUTEROL SULFATE HFA 108 (90 BASE) MCG/ACT IN AERS
2.0000 | INHALATION_SPRAY | RESPIRATORY_TRACT | Status: DC | PRN
  Filled 2020-02-18: qty 6.7

## 2020-02-18 NOTE — Consult Note (Signed)
Pharmacy Antibiotic Note  Lisa Moon is a 86 y.o. female admitted on 02/18/2020 with Cholecystitis, sepsis, also has layered pleural effusion. Pt presented with back pain, nausea and vomiting. Pt awoke in the middle of the night diaphoretic with chills, shortness of breath, and dizziness. Pt was complaining of a rapid heart rate with cardizem given by EMS. PMH includes HLD, HTN, and hyperglycemia. Pt is in atrial fibrillation.  Pharmacy has been consulted for cefepime dosing for cholecystitis, sepsis, and layered pleural effusion.   Afebrile, LA 4.5>>3.7, WBC 12.7  Plan: cefepime 2g IV q12h  Monitor renal function  Height: 5\' 6"  (167.6 cm) Weight: 80 kg (176 lb 5.9 oz) IBW/kg (Calculated) : 59.3  Temp (24hrs), Avg:97.7 F (36.5 C), Min:97.7 F (36.5 C), Max:97.7 F (36.5 C)  Recent Labs  Lab 02/15/20 1950 02/18/20 0921 02/18/20 1101 02/18/20 1235  WBC 12.7* 12.7*  --   --   CREATININE 0.64 1.05*  --   --   LATICACIDVEN  --   --  4.5* 3.7*    Estimated Creatinine Clearance: 41.8 mL/min (A) (by C-G formula based on SCr of 1.05 mg/dL (H)).    Allergies  Allergen Reactions  . Penicillins Rash  . Sulfa Antibiotics Rash  . Verapamil Rash    Antimicrobials this admission: 12/19 cefepime>> 12/19 metronidazole>>  Microbiology results: 12/19 BCx: pending  Thank you for allowing pharmacy to be a part of this patient's care.  1/20, PharmD Pharmacy Resident  02/18/2020 1:14 PM

## 2020-02-18 NOTE — Consult Note (Signed)
ANTICOAGULATION CONSULT NOTE - Initial Consult  Pharmacy Consult for heparin Indication: atrial fibrillation  Allergies  Allergen Reactions  . Penicillins Rash  . Sulfa Antibiotics Rash  . Verapamil Rash    Patient Measurements: Height: 5\' 6"  (167.6 cm) Weight: 80 kg (176 lb 5.9 oz) IBW/kg (Calculated) : 59.3 Heparin Dosing Weight: 75.9  Vital Signs: Temp: 97.7 F (36.5 C) (12/19 1108) Temp Source: Oral (12/19 1108) BP: 115/64 (12/19 2137) Pulse Rate: 91 (12/19 2137)  Labs: Recent Labs    02/18/20 0921 02/18/20 1102 02/18/20 1507 02/18/20 1800 02/18/20 1830  HGB 15.1*  --   --   --   --   HCT 46.0  --   --   --   --   PLT 248  --   --   --   --   APTT 29  --   --   --   --   LABPROT 13.4  --   --   --   --   INR 1.1  --   --   --   --   HEPARINUNFRC  --   --   --   --  0.67  CREATININE 1.05*  --  1.00  --   --   TROPONINIHS 22* 20* 22* 24*  --     Estimated Creatinine Clearance: 43.9 mL/min (by C-G formula based on SCr of 1 mg/dL).   Medical History: Past Medical History:  Diagnosis Date  . A-fib (HCC)   . HLD (hyperlipidemia)   . Hypertension     Medications:  No anticoagulants prior to admission per chart review  Assessment: 86 year old female presenting with back pain and nausea/vomiting. Pt awoke in the middle of the night diaphoretic with chills, shortness of breath, and dizziness. Pt was complaining of a rapid heart rate with cardizem given by EMS. PMH includes HLD, HTN, and hyperglycemia. Pt is in atrial fibrillation. Pt has a history of atrial fibrillation with an ablation in 1993. Pt was here 12/16 in afib with RVR but left due to wait time. Pharmacy consulted to dose heparin for afib.   K 5.2, BNP 628, troponin 32>22, CBC stable  12/19 1830 HL 0.67. therapeutic  Goal of Therapy:  Heparin level 0.3-0.7 units/ml Monitor platelets by anticoagulation protocol: Yes   Plan:  Continue heparin infusion at 1000 units/hr Check anti-Xa level in 8  hours and then daily while on heparin Continue to monitor H&H and platelets  1/20, PharmD, [facility] 02/18/2020 9:59 PM

## 2020-02-18 NOTE — H&P (Addendum)
History and Physical    Lisa Moon ZOX:[id number] DOB: 12/16/1932 DOA: 02/18/2020  Referring MD/NP/PA:   PCP: Lynnea FerrierKlein, Bert J III, MD   Patient coming from:  The patient is coming from home.  At baseline, pt is independent for most of ADL.        Chief Complaint: abdominal pain, palpitation  HPI: Lisa Moon is a 86 y.o. female with medical history significant of hypertension, hyperlipidemia, atrial fibrillation, who presents with abdominal pain, palpitation.  Pt has been having abdominal pain for almost about week. She also has nausea, vomiting, initially had some diarrhea which had resolved today. Patient states she has vomited twice with nonbilious nonbloody vomiting today. Her abdominal pain is located in the central abdomen, mild, sharp, nonradiating. Denies fever or chills.  Patient has some mild shortness breath, denies chest pain and cough. Pt also has palpitation. She saw her PCP on 12/17 and was noted to have AFib RVR. She was started on diltiazem. Overnight, pt states that her HR was running in the low 40s with low blood pressure. She has lightheaded and dizzy all night. She states she took her metoprolol this AM. She states that she felt so weak that she is unable to ambulate due to this. No unilateral numbness or tingling in extremities. No facial droop or slurred speech.  Initially pt was found to have A fib with RVR with HR 140-150s. Pt was given one dose of cardizem. HR dropped to 20-30s. Then pt was treated with 0.4 mg atropine, glucagon 1 mg, and started epinephrine drip. Her heart rate improved to 90-100s, then increase to 130s. Epinephrine drip was discontinued. Started amiodarone gtt.  Patient had hypotension with blood pressure 78/53, which improved to 107/75 after giving total of 1.5 L normal saline bolus and correction of her bradycardia.   ED Course: pt was found to have WBC 12.7, negative Covid PCR, lactic acid 4.5, INR 1.1, PTT 29, troponin level 22, 20, potassium  5.2, sodium 126, mild AKI with creatinine 1.05, BUN 25 GFR 52 (creatinine 0.  64 on 02/16/2020), BNP 628, liver function (ALP 124, AST 114, ALT 143, total bili 1.1).  Chest x-ray showed layered pleural effusion (right is worse than the left and possible left basilar opacity.  CT of abdomen/pelvis showed cholelithiasis and possible acute cholecystitis.  Patient is admitted to stepdown as inpatient. Dr. Lady GaryFath of card, Dr. Belia HemanKasa of ICU and Dr. Everlene FarrierPabon of surgery were consulted.  CT-abdomen/pelvis: 1. New gallbladder wall thickening and adjacent fat stranding. Cholelithiasis. The constellation of findings is concerning for acute cholecystitis. Recommend right upper quadrant ultrasound. 2. There is a moderate to large right-sided pleural effusion again identified. The smaller left effusion is larger in the interval. 3. Scattered colonic diverticulosis without diverticulitis. 4. Fluid in the pelvis is likely reactive to the gallbladder findings.    Review of Systems:   General: no fevers, chills, no body weight gain, has poor appetite, has fatigue HEENT: no blurry vision, hearing changes or sore throat Respiratory: has dyspnea, no coughing, wheezing CV: no chest pain, has palpitations GI: has nausea, vomiting, abdominal pain, diarrhea, no constipation GU: no dysuria, burning on urination, increased urinary frequency, hematuria  Ext: has leg edema Neuro: no unilateral weakness, numbness, or tingling, no vision change or hearing loss Skin: no rash, no skin tear. MSK: No muscle spasm, no deformity, no limitation of range of movement in spin Heme: No easy bruising.  Travel history: No recent long distant travel.  Allergy:  Allergies  Allergen Reactions  . Penicillins Rash  . Sulfa Antibiotics Rash  . Verapamil Rash    Past Medical History:  Diagnosis Date  . A-fib (HCC)   . HLD (hyperlipidemia)   . Hypertension     Past Surgical History:  Procedure Laterality Date  . ABDOMINAL  HYSTERECTOMY    . CARDIAC ELECTROPHYSIOLOGY MAPPING AND ABLATION      Social History:  reports that she has never smoked. She has never used smokeless tobacco. No history on file for alcohol use and drug use.  Family History:  Family History  Problem Relation Age of Onset  . Liver cancer Mother   . Heart attack Mother   . CAD Father      Prior to Admission medications   Medication Sig Start Date End Date Taking? Authorizing Provider  aspirin EC 81 MG tablet Take 81 mg by mouth daily.    [provider]  Calcium Carbonate-Vitamin D (CALTRATE 600+D PO) Take 1 tablet by mouth 2 (two) times daily with a meal.    [provider]  furosemide (LASIX) 40 MG tablet Take 1 tablet (40 mg total) by mouth 2 (two) times daily. 08/01/18 08/01/19  Auburn Bilberry, MD  lovastatin (MEVACOR) 40 MG tablet Take 40 mg by mouth at bedtime. 05/25/18   [provider]  metoprolol tartrate (LOPRESSOR) 50 MG tablet Take 50 mg by mouth 2 (two) times daily. 06/15/18   [provider]  Multiple Vitamins-Minerals (MULTIVITAMIN ADULT PO) Take 1 tablet by mouth daily.    [provider]    Physical Exam: Vitals:   02/18/20 1400 02/18/20 1415 02/18/20 1430 02/18/20 1445  BP: 119/87 (!) 125/111 (!) 138/92 130/70  Pulse: (!) 112 93 (!) 106 (!) 116  Resp: (!) 34 (!) 29 (!) 30 (!) 27  Temp:      TempSrc:      SpO2: 97% 98% 96% 99%  Weight:      Height:       General: Not in acute distress HEENT:       Eyes: PERRL, EOMI, no scleral icterus.       ENT: No discharge from the ears and nose, no pharynx injection, no tonsillar enlargement.        Neck: No JVD, no bruit, no mass felt. Heme: No neck lymph node enlargement. Cardiac: S1/S2, irregularly irregular rhythm, No murmurs, No gallops or rubs. Respiratory: Has decreased air movement on right side. GI: Soft, nondistended, mild tenderness in central and RUQ, no rebound pain, no organomegaly, BS present. GU: No  hematuria Ext: has trace leg edema bilaterally. 2+DP/PT pulse bilaterally. Musculoskeletal: No joint deformities, No joint redness or warmth, no limitation of ROM in spin. Skin: No rashes.  Neuro: Alert, oriented X3, cranial nerves II-XII grossly intact, moves all extremities normally. Psych: Patient is not psychotic, no suicidal or hemocidal ideation.  Labs on Admission: I have personally reviewed following labs and imaging studies  CBC: Recent Labs  Lab 02/15/20 1950 02/18/20 0921  WBC 12.7* 12.7*  HGB 14.8 15.1*  HCT 43.8 46.0  MCV 84.6 85.7  PLT 240 248   Basic Metabolic Panel: Recent Labs  Lab 02/15/20 1950 02/18/20 0921  NA 129* 126*  K 4.5 5.2*  CL 97* 94*  CO2 22 18*  GLUCOSE 217* 282*  BUN 17 25*  CREATININE 0.64 1.05*  CALCIUM 8.9 8.9  MG  --  2.1   GFR: Estimated Creatinine Clearance: 41.8 mL/min (A) (by C-G formula based on SCr of  1.05 mg/dL (H)). Liver Function Tests: Recent Labs  Lab 02/15/20 1950 02/18/20 0921  AST 40 114*  ALT 35 143*  ALKPHOS 88 124  BILITOT 1.1 1.1  PROT 7.8 7.6  ALBUMIN 3.6 3.6   Recent Labs  Lab 02/15/20 1950  LIPASE 20   No results for input(s): AMMONIA in the last 168 hours. Coagulation Profile: Recent Labs  Lab 02/18/20 0921  INR 1.1   Cardiac Enzymes: No results for input(s): CKTOTAL, CKMB, CKMBINDEX, TROPONINI in the last 168 hours. BNP (last 3 results) No results for input(s): PROBNP in the last 8760 hours. HbA1C: No results for input(s): HGBA1C in the last 72 hours. CBG: No results for input(s): GLUCAP in the last 168 hours. Lipid Profile: No results for input(s): CHOL, HDL, LDLCALC, TRIG, CHOLHDL, LDLDIRECT in the last 72 hours. Thyroid Function Tests: Recent Labs    02/18/20 0921  TSH 2.258   Anemia Panel: No results for input(s): VITAMINB12, FOLATE, FERRITIN, TIBC, IRON, RETICCTPCT in the last 72 hours. Urine analysis: No results found for: COLORURINE, APPEARANCEUR, LABSPEC, PHURINE,  GLUCOSEU, HGBUR, BILIRUBINUR, KETONESUR, PROTEINUR, UROBILINOGEN, NITRITE, LEUKOCYTESUR Sepsis Labs: @LABRCNTIP (procalcitonin:4,lacticidven:4) ) Recent Results (from the past 240 hour(s))  Resp Panel by RT-PCR (Flu A&B, Covid) Nasopharyngeal Swab     Status: None   Collection Time: 02/18/20  9:21 AM   Specimen: Nasopharyngeal Swab; Nasopharyngeal(NP) swabs in vial transport medium  Result Value Ref Range Status   SARS Coronavirus 2 by RT PCR NEGATIVE NEGATIVE Final    Comment: (NOTE) SARS-CoV-2 target nucleic acids are NOT DETECTED.  The SARS-CoV-2 RNA is generally detectable in upper respiratory specimens during the acute phase of infection. The lowest concentration of SARS-CoV-2 viral copies this assay can detect is 138 copies/mL. A negative result does not preclude SARS-Cov-2 infection and should not be used as the sole basis for treatment or other patient management decisions. A negative result may occur with  improper specimen collection/handling, submission of specimen other than nasopharyngeal swab, presence of viral mutation(s) within the areas targeted by this assay, and inadequate number of viral copies(<138 copies/mL). A negative result must be combined with clinical observations, patient history, and epidemiological information. The expected result is Negative.  Fact Sheet for Patients:  02/20/20  Fact Sheet for Healthcare Providers:  BloggerCourse.com  This test is no t yet approved or cleared by the [employer] FDA and  has been authorized for detection and/or diagnosis of SARS-CoV-2 by FDA under an Emergency Use Authorization (EUA). This EUA will remain  in effect (meaning this test can be used) for the duration of the COVID-19 declaration under Section 564(b)(1) of the Act, 21 U.S.C.section 360bbb-3(b)(1), unless the authorization is terminated  or revoked sooner.       Influenza A by PCR NEGATIVE  NEGATIVE Final   Influenza B by PCR NEGATIVE NEGATIVE Final    Comment: (NOTE) The Xpert Xpress SARS-CoV-2/FLU/RSV plus assay is intended as an aid in the diagnosis of influenza from Nasopharyngeal swab specimens and should not be used as a sole basis for treatment. Nasal washings and aspirates are unacceptable for Xpert Xpress SARS-CoV-2/FLU/RSV testing.  Fact Sheet for Patients: [place]  Fact Sheet for Healthcare Providers: BloggerCourse.com  This test is not yet approved or cleared by the [employer] FDA and has been authorized for detection and/or diagnosis of SARS-CoV-2 by FDA under an Emergency Use Authorization (EUA). This EUA will remain in effect (meaning this test can be used) for the duration of the COVID-19 declaration under Section  564(b)(1) of the Act, 21 U.S.C. section 360bbb-3(b)(1), unless the authorization is terminated or revoked.  Performed at [facility], [address] [place], [place] [address]      Radiological Exams on Admission: CT ABDOMEN PELVIS W CONTRAST  Result Date: 02/18/2020 CLINICAL DATA:  Diverticulitis suspected. Nausea and vomiting. Rapid heart rate. EXAM: CT ABDOMEN AND PELVIS WITH CONTRAST TECHNIQUE: Multidetector CT imaging of the abdomen and pelvis was performed using the standard protocol following bolus administration of intravenous contrast. CONTRAST:  63mL OMNIPAQUE IOHEXOL 300 MG/ML  SOLN COMPARISON:  August 01, 2018 FINDINGS: Lower chest: There is a moderate to large right-sided pleural effusion again identified. There is a smaller left effusion, larger in the interval. Atelectasis is associated with the effusions. Cardiomegaly persists. No other abnormalities in the lower chest. Hepatobiliary: There is new gallbladder wall thickening. Cholelithiasis is identified. There is increased attenuation in the fat of the porta hepatis adjacent to the gallbladder. Hepatic  steatosis. No hepatic masses. The portal vein is patent. Pancreas: Fatty deposition in the neck of the pancreas. No mass or evidence of pancreatitis. Spleen: Normal in size without focal abnormality. Adrenals/Urinary Tract: Adrenal glands are normal. No renal stones, hydronephrosis, or perinephric stranding. No renal masses are noted. No ureterectasis or ureteral stones. The bladder is normal. Stomach/Bowel: The stomach and small bowel are normal. Scattered colonic diverticulosis is seen without diverticulitis. The appendix is not seen but there is no secondary evidence of appendicitis. Vascular/Lymphatic: Calcified atherosclerosis is seen in the nonaneurysmal aorta. No suspicious adenopathy. Reproductive: Status post hysterectomy. No adnexal masses. Other: Fluid in the pelvis is likely reactive to the gallbladder findings described above. Musculoskeletal: No acute or significant osseous findings. IMPRESSION: 1. New gallbladder wall thickening and adjacent fat stranding. Cholelithiasis. The constellation of findings is concerning for acute cholecystitis. Recommend right upper quadrant ultrasound. 2. There is a moderate to large right-sided pleural effusion again identified. The smaller left effusion is larger in the interval. 3. Scattered colonic diverticulosis without diverticulitis. 4. Fluid in the pelvis is likely reactive to the gallbladder findings. Electronically Signed   By: Gerome Sam III M.D   On: 02/18/2020 11:28   DG Chest Portable 1 View  Result Date: 02/18/2020 CLINICAL DATA:  Chest pain.  Tachycardia. EXAM: PORTABLE CHEST 1 VIEW COMPARISON:  Jul 30, 2018. FINDINGS: Layering right greater than left pleural effusions. Hazy opacity the right lung, favored to reflect layering fluid. Additional more focal opacities at the left lung base. Mild diffuse interstitial prominence. No visible pneumothorax on this limited portable supine radiograph. Biapical pleuroparenchymal scarring. Enlarged cardiac  silhouette. Aortic atherosclerosis. No acute osseous abnormality IMPRESSION: 1. Layering right greater than left pleural effusions. Mild interstitial prominence, suggestive of mild interstitial edema. 2. Left basilar opacities may represent atelectasis, aspiration, and/or pneumonia. 3. Cardiomegaly. Electronically Signed   By: Feliberto Harts MD   On: 02/18/2020 10:15     EKG: I have personally reviewed.  First EKG showed A. fib with bradycardia, heart rate 40, QTC 402, the repeated EKG showed atrial fibrillation with QTC 465, heart rate of 120, low voltage, nonspecific T wave change.   Assessment/Plan Active Problems:   Acute calculous cholecystitis   Severe sepsis with septic shock (HCC)   Atrial fibrillation with RVR (HCC)   Bradycardia   HLD (hyperlipidemia)   Hypertension   Pleural effusion   Hyperkalemia   AKI (acute kidney injury) (HCC)   Elevated troponin   Hyponatremia   Severe sepsis with septic shock due to  acute calculous cholecystitis: CT scan showed acute calculus cholecystitis. Pending right upper quadrant ultrasound. Meets criteria for severe sepsis with septic shock with leukocytosis, hypotension, tachycardia, tachypnea. Lactic acid is up to 4.5. Blood pressure responded to the IV fluid treatment. Currently hemodynamically stabilized. Dr. Everlene Farrier of surgery is consulted. Dr. Belia Heman of intensive care is consulted, recommended stepdown admission.  -will admit to stepdown as inpatient -IV cefepime and Flagyl was started, will continue -Follow-up of blood culture -will get Procalcitonin and trend lactic acid levels per sepsis protocol. -IVF: total of 1.5 L of NS bolus in ED and 75 cc/h (patient has elevated BNP 628, needs to be careful in giving IV fluids treatment). -prn Zofran for nausea, morphine for pain  Atrial fibrillation with RVR and bradycardia: Patient initially had A. fib with RVR, but developed bradycardia after giving Cardizem treatment. Bradycardia was  corrected after treatment in ED. Current heart rate comes back to 130s. Cardiology, Dr. Lady Gary is consulted, recommended to start amiodarone. TSH 2.258 -will start Amiodarone -IV heparin per Dr. Lady Gary  Elevated troponin: Troponin level 22, 20, denies chest pain. Likely due to demand ischemia. -Trend troponin -Recheck A1c, FLP -continue ASA, pravastatin -Patient is on IV heparin due to A. fib with RVR  HLD (hyperlipidemia) -Pravastatin  Hypertension: -Hold metoprolol and blood pressure medications due to initial hypotension and the current dose of the blood pressure  Hyponatremia: Na 126, mental status normal. Most likely due to GI loss, poor oral intake and dehydration,  - Will check urine sodium, urine osmolality, serum osmolality. - check TSH -->2.258 - IVF: 1.5 L NS in ED --> will f/u BMP q8h to decide further IVF of NS rate - avoid over correction too fast due to risk of central pontine myelinolysis  Hyperkalemia: mild, K 5.2 -->5.3 -IVF as above -Lokelma 10 g -f/u by BMP  AKI (acute kidney injury) (HCC): mild, Cre 1.05 and BUN 25, GFR 0.64 -IVF as above -f/u by BMP  Pleural effusion: Chest x-ray showed layered pleural effusion (right side is worse than the left). Dr. Fredia Sorrow of IR is consulted for thoracentesis. Dr. Fredia Sorrow reviewed the image, thinks that patient has moderate right pleural effusion, since his schedule is too tight today, they will perform thoracentesis tomorrow. -US-thoracentesis is ordered -Related lab tests for pleural fluid analysis are also ordered   DVT ppx: on IV Heparin    Code Status: Full code (dicussed with pt in the presence of her daughter, patient want to be full code) Family Communication:   Yes, patient's  daughter at bed side Disposition Plan:  Anticipate discharge back to previous environment Consults called:  Dr. Lady Gary of card, Dr. Belia Heman of ICU, Dr. Everlene Farrier of surgery, Dr. Fredia Sorrow for IR Admission status:   SDU/inpation          Status  is: Inpatient  Remains inpatient appropriate because:Inpatient level of care appropriate due to severity of illness   Dispo: The patient is from: Home              Anticipated d/c is to: Home              Anticipated d/c date is: 2 days              Patient currently is not medically stable to d/c.           Date of Service 02/18/2020    Lorretta Harp [facility]   If 7PM-7AM, please contact night-coverage [email or web address] 02/18/2020, 3:03 PM

## 2020-02-18 NOTE — ED Triage Notes (Addendum)
Pt braught in by EMS complaints of rapid heart rate. Per em,s 1405-150s. Pt given cardizem iv in route. Hr droped to 58s. On arrival patient complaints of nausea and not feeling well ekg obtained hr in 30s IN eD . Patient place on pads.

## 2020-02-18 NOTE — ED Notes (Signed)
Ultrasound at bedside

## 2020-02-18 NOTE — Consult Note (Signed)
CODE SEPSIS - PHARMACY COMMUNICATION  **Broad Spectrum Antibiotics should be administered within 1 hour of Sepsis diagnosis**  Time Code Sepsis Called/Page Received: 1249  Antibiotics Ordered: cefepime & metronidazole  Time of 1st antibiotic administration: 1123  Additional action taken by pharmacy: N/A  If necessary, Name of Provider/Nurse Contacted: N/A  Reatha Armour, PharmD Pharmacy Resident  02/18/2020 1:35 PM

## 2020-02-18 NOTE — ED Provider Notes (Signed)
[facility] Emergency Department Provider Note  ____________________________________________   Event Date/Time   First MD Initiated Contact with Patient 02/18/20 [id number]     (approximate)  I have reviewed the triage vital signs and the nursing notes.   HISTORY  Chief Complaint Tachycardia (Afib/)    HPI Lisa HatchetJoanne Moon is a 86 y.o. female  With h/o HTN, HLD, Afib here with bradycardia, hypotension. Pt reportedly has had nausea, diarrhea, fatigue for the past week or so. She went to the ED on 12/16, but left after waiting too long. She saw her PCP on 12/17 and was noted to be dehydrated and was in AFib RVR. She was started on diltiazem. Overnight, pt states that her HR was running in the low 40s with hypotension, shortness of breath. She felt lightheaded and dizzy all night. She denies any CP. She states she did take her metoprolol this AM. She endorses continued fatigue, weakness. Unable to ambulate due to this. No fevers. She does continue to have diffuse abd pain and discomfort, diarrhea. No vomiting. She's had poor appetite.       Past Medical History:  Diagnosis Date  . A-fib (HCC)   . HLD (hyperlipidemia)   . Hypertension     Patient Active Problem List   Diagnosis Date Noted  . Acute calculous cholecystitis 02/18/2020  . Severe sepsis with septic shock (HCC) 02/18/2020  . Atrial fibrillation with RVR (HCC) 02/18/2020  . Bradycardia 02/18/2020  . Hyponatremia 02/18/2020  . HLD (hyperlipidemia)   . Hypertension   . Pleural effusion   . Hyperkalemia   . AKI (acute kidney injury) (HCC)   . Elevated troponin   . Bilateral pleural effusion   . CAP (community acquired pneumonia) 07/30/2018    Past Surgical History:  Procedure Laterality Date  . ABDOMINAL HYSTERECTOMY    . CARDIAC ELECTROPHYSIOLOGY MAPPING AND ABLATION      Prior to Admission medications   Medication Sig Start Date End Date Taking? Authorizing Provider  aspirin EC 81 MG tablet  Take 81 mg by mouth daily.   Yes [provider]  calcium carbonate (OS-CAL) 1250 (500 Ca) MG chewable tablet Chew 1 tablet by mouth 2 (two) times daily.   Yes [provider]  diltiazem (CARDIZEM) 30 MG tablet Take 30 mg by mouth 4 (four) times daily. 02/16/20  Yes [provider]  lovastatin (MEVACOR) 40 MG tablet Take 40 mg by mouth at bedtime. 05/25/18  Yes [provider]  metoprolol tartrate (LOPRESSOR) 50 MG tablet Take 50 mg by mouth 2 (two) times daily. 06/15/18  Yes [provider]  Multiple Vitamins-Minerals (MULTIVITAMIN ADULT PO) Take 1 tablet by mouth daily.   Yes [provider]  ondansetron (ZOFRAN-ODT) 4 MG disintegrating tablet Take 4 mg by mouth every 8 (eight) hours as needed for nausea. 02/16/20  Yes [provider]    Allergies Penicillins, Sulfa antibiotics, and Verapamil  Family History  Problem Relation Age of Onset  . Liver cancer Mother   . Heart attack Mother   . CAD Father     Social History Social History   Tobacco Use  . Smoking status: Never Smoker  . Smokeless tobacco: Never Used    Review of Systems  Review of Systems  Constitutional: Positive for fatigue. Negative for fever.  HENT: Negative for congestion and sore throat.   Eyes: Negative for visual disturbance.  Respiratory: Positive for shortness of breath. Negative for cough.   Cardiovascular: Negative for chest pain.  Gastrointestinal: Positive for diarrhea and nausea. Negative for abdominal pain and vomiting.  Genitourinary: Negative for flank pain.  Musculoskeletal: Negative for back pain and neck pain.  Skin: Negative for rash and wound.  Neurological: Positive for dizziness, weakness and light-headedness.  All other systems reviewed and are negative.    ____________________________________________  PHYSICAL EXAM:      VITAL SIGNS: ED Triage Vitals  Enc Vitals Group     BP 02/18/20 0914 (!) 78/53     Pulse Rate  02/18/20 0914 (!) 37     Resp 02/18/20 0935 (!) 22     Temp --      Temp src --      SpO2 02/18/20 0914 99 %     Weight 02/18/20 0915 176 lb 5.9 oz (80 kg)     Height 02/18/20 0915  (1.676 m)     Head Circumference --      Peak Flow --      Pain Score 02/18/20 0915 0     Pain Loc --      Pain Edu? --      Excl. in GC? --      Physical Exam Vitals and nursing note reviewed.  Constitutional:      General: She is not in acute distress.    Appearance: She is well-developed. She is ill-appearing and diaphoretic.  HENT:     Head: Normocephalic and atraumatic.     Mouth/Throat:     Mouth: Mucous membranes are dry.  Eyes:     Conjunctiva/sclera: Conjunctivae normal.  Cardiovascular:     Rate and Rhythm: Regular rhythm. Bradycardia present.     Heart sounds: Normal heart sounds. No murmur heard. No friction rub.  Pulmonary:     Effort: Pulmonary effort is normal. No respiratory distress.     Breath sounds: Normal breath sounds. No wheezing or rales.  Abdominal:     General: There is no distension.     Palpations: Abdomen is soft.     Tenderness: There is no abdominal tenderness.  Musculoskeletal:     Cervical back: Neck supple.  Skin:    General: Skin is warm.     Capillary Refill: Capillary refill takes less than 2 seconds.     Coloration: Skin is pale.  Neurological:     Mental Status: She is alert and oriented to person, place, and time.     Motor: No abnormal muscle tone.       ____________________________________________   LABS (all labs ordered are listed, but only abnormal results are displayed)  Labs Reviewed  CBC - Abnormal; Notable for the following components:      Result Value   WBC 12.7 (*)    RBC 5.37 (*)    Hemoglobin 15.1 (*)    All other components within normal limits  COMPREHENSIVE METABOLIC PANEL - Abnormal; Notable for the following components:   Sodium 126 (*)    Potassium 5.2 (*)    Chloride 94 (*)    CO2 18 (*)    Glucose, Bld  282 (*)    BUN 25 (*)    Creatinine, Ser 1.05 (*)    AST 114 (*)    ALT 143 (*)    GFR, Estimated 52 (*)    All other components within normal limits  BRAIN NATRIURETIC PEPTIDE - Abnormal; Notable for the following components:   B Natriuretic Peptide 628.7 (*)    All other components within normal limits  LACTIC ACID, PLASMA -  Abnormal; Notable for the following components:   Lactic Acid, Venous 4.5 (*)    All other components within normal limits  LACTIC ACID, PLASMA - Abnormal; Notable for the following components:   Lactic Acid, Venous 3.7 (*)    All other components within normal limits  BASIC METABOLIC PANEL - Abnormal; Notable for the following components:   Sodium 129 (*)    Potassium 5.3 (*)    Chloride 94 (*)    Glucose, Bld 198 (*)    GFR, Estimated 55 (*)    All other components within normal limits  TROPONIN I (HIGH SENSITIVITY) - Abnormal; Notable for the following components:   Troponin I (High Sensitivity) 22 (*)    All other components within normal limits  TROPONIN I (HIGH SENSITIVITY) - Abnormal; Notable for the following components:   Troponin I (High Sensitivity) 20 (*)    All other components within normal limits  TROPONIN I (HIGH SENSITIVITY) - Abnormal; Notable for the following components:   Troponin I (High Sensitivity) 22 (*)    All other components within normal limits  RESP PANEL BY RT-PCR (FLU A&B, COVID) ARPGX2  CULTURE, BLOOD (ROUTINE X 2)  CULTURE, BLOOD (ROUTINE X 2)  MAGNESIUM  TSH  APTT  PROTIME-INR  PROCALCITONIN  OSMOLALITY  LACTATE DEHYDROGENASE  HEPARIN LEVEL (UNFRACTIONATED)  OSMOLALITY, URINE  SODIUM, URINE, RANDOM  BASIC METABOLIC PANEL  BASIC METABOLIC PANEL  CBC  HEMOGLOBIN A1C  LIPID PANEL  CBG MONITORING, ED  CBG MONITORING, ED  CBG MONITORING, ED  TROPONIN I (HIGH SENSITIVITY)    ____________________________________________  EKG: Atrial fibrillation with ventricular rate 40.  QRS 109, QTc 409.  Profound  bradycardia noted. ________________________________________  RADIOLOGY All imaging, including plain films, CT scans, and ultrasounds, independently reviewed by me, and interpretations confirmed via formal radiology reads.  ED MD interpretation:   CXR: Layering effusions, possible consolidation CT A/P: Findings concerning for cholecystitis  Official radiology report(s): CT ABDOMEN PELVIS W CONTRAST  Result Date: 02/18/2020 CLINICAL DATA:  Diverticulitis suspected. Nausea and vomiting. Rapid heart rate. EXAM: CT ABDOMEN AND PELVIS WITH CONTRAST TECHNIQUE: Multidetector CT imaging of the abdomen and pelvis was performed using the standard protocol following bolus administration of intravenous contrast. CONTRAST:  65mL OMNIPAQUE IOHEXOL 300 MG/ML  SOLN COMPARISON:  August 01, 2018 FINDINGS: Lower chest: There is a moderate to large right-sided pleural effusion again identified. There is a smaller left effusion, larger in the interval. Atelectasis is associated with the effusions. Cardiomegaly persists. No other abnormalities in the lower chest. Hepatobiliary: There is new gallbladder wall thickening. Cholelithiasis is identified. There is increased attenuation in the fat of the porta hepatis adjacent to the gallbladder. Hepatic steatosis. No hepatic masses. The portal vein is patent. Pancreas: Fatty deposition in the neck of the pancreas. No mass or evidence of pancreatitis. Spleen: Normal in size without focal abnormality. Adrenals/Urinary Tract: Adrenal glands are normal. No renal stones, hydronephrosis, or perinephric stranding. No renal masses are noted. No ureterectasis or ureteral stones. The bladder is normal. Stomach/Bowel: The stomach and small bowel are normal. Scattered colonic diverticulosis is seen without diverticulitis. The appendix is not seen but there is no secondary evidence of appendicitis. Vascular/Lymphatic: Calcified atherosclerosis is seen in the nonaneurysmal aorta. No suspicious  adenopathy. Reproductive: Status post hysterectomy. No adnexal masses. Other: Fluid in the pelvis is likely reactive to the gallbladder findings described above. Musculoskeletal: No acute or significant osseous findings. IMPRESSION: 1. New gallbladder wall thickening and adjacent fat stranding. Cholelithiasis. The constellation  of findings is concerning for acute cholecystitis. Recommend right upper quadrant ultrasound. 2. There is a moderate to large right-sided pleural effusion again identified. The smaller left effusion is larger in the interval. 3. Scattered colonic diverticulosis without diverticulitis. 4. Fluid in the pelvis is likely reactive to the gallbladder findings. Electronically Signed   By: Gerome Sam III M.D   On: 02/18/2020 11:28   DG Chest Portable 1 View  Result Date: 02/18/2020 CLINICAL DATA:  Chest pain.  Tachycardia. EXAM: PORTABLE CHEST 1 VIEW COMPARISON:  Jul 30, 2018. FINDINGS: Layering right greater than left pleural effusions. Hazy opacity the right lung, favored to reflect layering fluid. Additional more focal opacities at the left lung base. Mild diffuse interstitial prominence. No visible pneumothorax on this limited portable supine radiograph. Biapical pleuroparenchymal scarring. Enlarged cardiac silhouette. Aortic atherosclerosis. No acute osseous abnormality IMPRESSION: 1. Layering right greater than left pleural effusions. Mild interstitial prominence, suggestive of mild interstitial edema. 2. Left basilar opacities may represent atelectasis, aspiration, and/or pneumonia. 3. Cardiomegaly. Electronically Signed   By: Feliberto Harts MD   On: 02/18/2020 10:15   US Abdomen Limited RUQ (LIVER/GB)  Result Date: 02/18/2020 CLINICAL DATA:  Right upper quadrant abdominal pain. EXAM: ULTRASOUND ABDOMEN LIMITED RIGHT UPPER QUADRANT COMPARISON:  CT from same day FINDINGS: Gallbladder: There is gallbladder wall thickening and pericholecystic free fluid. Multiple gallstones are  noted. The sonographic Eulah Pont sign is negative. Common bile duct: Diameter: 3 mm Liver: No focal lesion identified. Within normal limits in parenchymal echogenicity. Portal vein is patent on color Doppler imaging with normal direction of blood flow towards the liver. Other: There is a right-sided pleural effusion. IMPRESSION: 1. Findings are equivocal for calculus cholecystitis. There are gallstones in the presence of gallbladder wall thickening and pericholecystic free fluid, however the sonographic Eulah Pont sign is reported as negative. Consider HIDA scan for further evaluation. 2. Large right-sided pleural effusion incidentally noted. Electronically Signed   By: Katherine Mantle M.D.   On: 02/18/2020 15:08    ____________________________________________  PROCEDURES   Procedure(s) performed (including Critical Care):  .Critical Care Performed by: Shaune Pollack, MD Authorized by: Shaune Pollack, MD   Critical care provider statement:    Critical care time (minutes):  75   Critical care time was exclusive of:  Separately billable procedures and treating other patients and teaching time   Critical care was necessary to treat or prevent imminent or life-threatening deterioration of the following conditions:  Circulatory failure, respiratory failure and cardiac failure   Critical care was time spent personally by me on the following activities:  Development of treatment plan with patient or surrogate, discussions with consultants, evaluation of patient's response to treatment, examination of patient, obtaining history from patient or surrogate, ordering and performing treatments and interventions, ordering and review of laboratory studies, ordering and review of radiographic studies, pulse oximetry, re-evaluation of patient's condition and review of old charts   I assumed direction of critical care for this patient from another provider in my specialty: no   .1-3 Lead EKG  Interpretation Performed by: Shaune Pollack, MD Authorized by: Shaune Pollack, MD     Interpretation: abnormal     ECG rate:  25-100   ECG rate assessment comment:  Primarily bradycardic   Rhythm comment:  Atrial fibrillation   Ectopy: PVCs     Conduction: normal   Comments:     Indication: Symptomatic bradycardia    ____________________________________________  INITIAL IMPRESSION / MDM / ASSESSMENT AND PLAN / ED COURSE  As part of my medical decision making, I reviewed the following data within the electronic MEDICAL RECORD NUMBER Nursing notes reviewed and incorporated, Old chart reviewed, Notes from prior ED visits, and  Controlled Substance Database       *Lisa Moon was evaluated in Emergency Department on 02/18/2020 for the symptoms described in the history of present illness. She was evaluated in the context of the global COVID-19 pandemic, which necessitated consideration that the patient might be at risk for infection with the SARS-CoV-2 virus that causes COVID-19. Institutional protocols and algorithms that pertain to the evaluation of patients at risk for COVID-19 are in a state of rapid change based on information released by regulatory bodies including the CDC and federal and state organizations. These policies and algorithms were followed during the patient's care in the ED.  Some ED evaluations and interventions may be delayed as a result of limited staffing during the pandemic.*     Medical Decision Making:  86 yo F here with profound bradycardia and hypotension.  Clinically, suspect symptomatic bradycardia 2/2 diltiazem and metoprolol use, which was likely started due to her Afib RVR in clinic in setting of n/v/d. She appears intravascularly dry, but has b/l effusions on CXR with some LE edema. She was given cautious fluids. Empirically started on Zosyn for intra-abd infection, with CT pending.  For her bradycardia, initially given atropine 0.4 mg x 2 with minimal  improvement. Started on epi gtt with improvement, and given calcium as well as glucagon for BB and CCB use. BP improving. Dr. Lady Gary with Cardiology consulted. Heparin gtt started for her Afib.   Labs otherwise reviewed. Moderate leukocytosis noted. CMP shows likely pre-renal AKI with hypovolemic hyponatremia, hyperkalemia. Insulin given (hyperglycemic as well), cautious fluids.   CT scan c/f cholecystitis. Pt given fluids, Zosyn. She is improving and off epi gtt. Cardiology Dr. Lady Gary, surgery Dr. Everlene Farrier, and intensivist Dr. Belia Heman consulted. Will be admitted to step down, with Dr. Clyde Lundborg.  ____________________________________________  FINAL CLINICAL IMPRESSION(S) / ED DIAGNOSES  Final diagnoses:  RUQ pain  Cholecystitis  Bradycardia  Atrial fibrillation, unspecified type (HCC)  Sepsis without acute organ dysfunction, due to unspecified organism [facility])     MEDICATIONS GIVEN DURING THIS VISIT:  Medications  heparin ADULT infusion 100 units/mL (25000 units/244mL sodium chloride 0.45%) (1,000 Units/hr Intravenous New Bag/Given 02/18/20 1130)  ondansetron (ZOFRAN) injection 4 mg (4 mg Intravenous Given 02/18/20 1308)  morphine 2 MG/ML injection 0.5 mg (has no administration in time range)  metroNIDAZOLE (FLAGYL) tablet 500 mg (has no administration in time range)  albuterol (VENTOLIN HFA) 108 (90 Base) MCG/ACT inhaler 2 puff (has no administration in time range)  ceFEPIme (MAXIPIME) 2 g in sodium chloride 0.9 % 100 mL IVPB (has no administration in time range)  amiodarone (NEXTERONE) 1.8 mg/mL load via infusion 75 mg (75 mg Intravenous Bolus from Bag 02/18/20 1432)    Followed by  amiodarone (NEXTERONE PREMIX) 360-4.14 MG/200ML-% (1.8 mg/mL) IV infusion (60 mg/hr Intravenous New Bag/Given 02/18/20 1433)    Followed by  amiodarone (NEXTERONE PREMIX) 360-4.14 MG/200ML-% (1.8 mg/mL) IV infusion (has no administration in time range)  0.9 %  sodium chloride infusion ( Intravenous Not Given 02/18/20  1641)  atropine 1 MG/10ML injection (0.4 mg  Given 02/18/20 0919)  calcium gluconate 1 g/ 50 mL sodium chloride IVPB (0 mg Intravenous Stopped 02/18/20 1059)  atropine 1 MG/10ML injection 0.4 mg (0.4 mg Intravenous Given 02/18/20 0910)  EPINEPHrine (ADRENALIN) 1 MG/10ML injection ( Intravenous Given by  Other 02/18/20 0938)  sodium chloride 0.9 % bolus 250 mL (0 mLs Intravenous Stopped 02/18/20 1201)  insulin aspart (novoLOG) injection 4 Units (4 Units Intravenous Given 02/18/20 1145)  heparin bolus via infusion 3,800 Units (3,800 Units Intravenous Bolus from Bag 02/18/20 1130)  iohexol (OMNIPAQUE) 300 MG/ML solution 80 mL (80 mLs Intravenous Contrast Given 02/18/20 1035)  ceFEPIme (MAXIPIME) 2 g in sodium chloride 0.9 % 100 mL IVPB (0 g Intravenous Stopped 02/18/20 1148)  metroNIDAZOLE (FLAGYL) tablet 500 mg (500 mg Oral Given 02/18/20 1152)  ondansetron (ZOFRAN) injection 4 mg (4 mg Intravenous Given 02/18/20 1118)  sodium chloride 0.9 % bolus 250 mL (0 mLs Intravenous Stopped 02/18/20 1201)  glucagon (human recombinant) (GLUCAGEN) injection 1 mg (1 mg Intravenous Given 02/18/20 1142)  sodium chloride 0.9 % bolus 1,000 mL (0 mLs Intravenous Stopped 02/18/20 1318)  sodium zirconium cyclosilicate (LOKELMA) packet 10 g (10 g Oral Given 02/18/20 1640)     ED Discharge Orders    None       Note:  This document was prepared using Dragon voice recognition software and may include unintentional dictation errors.   Shaune Pollack, MD 02/18/20 [phone]

## 2020-02-18 NOTE — ED Notes (Signed)
 epi given by dr Erma Heritage at this time while waiting on epi drip

## 2020-02-18 NOTE — Consult Note (Signed)
Cardiology Consultation Note    Patient ID: Lisa Moon, MRN: [id number], DOB/AGE: March 20, 1932 86 y.o. Admit date: 02/18/2020   Date of Consult: 02/18/2020 Primary Physician: Lynnea Ferrier, MD Primary Cardiologist:   Chief Complaint:  Reason for Consultation: bradycardia Requesting MD: Dr. Erma Heritage  HPI: Lisa Moon is a 86 y.o. female with history of hyperlipidemia, hypertension, hyperglycemia with a history of moderate aortic mitral and tricuspid insufficiency.  She is a history of atrial fibrillation and she had an A. fib ablation at [facility] in 1993.  She was recently earlier this week for palpitations and tachycardia.  She also had severe diarrhea with nausea and poor p.o. intake.  She presented to the emergency room however did not complete the visit due to prolonged wait.  Her sodium level was 129 with a white count of 12.7.  She had been treated with metoprolol tartrate 50 mg twice daily.  Due to her rapid heart rate she had Cardizem 30 mg twice daily for rate control continue with metoprolol.  She had an appointment set up with cardiology.  She presented to the emergency room's morning with plaints of rapid heart rate.  She was given Cardizem in route and her heart rate dropped from the 150s to the 50s.  She developed nausea and was noted to have a heart rate in the 30s.  She was given low-dose epi drip with improved heart rate in the 60s.  She is currently hemodynamically stable.  Her BNP is 628.  She had a serum troponin drawn which was 22 similar to a level of 32 2 days ago.  No chest pain.  Electrolytes revealed acute on chronic renal insufficiency with a creatinine of 1.05.  BUN 25.  Potassium 5.2.  Sodium 126.  Patient and her daughter states she has had diarrhea nausea and poor p.o. intake for several days.  She has not had recurrent atrial fibrillation since her ablation in the 90s.  She is on chronic metoprolol tartrate.  She took the metoprolol tartrate this morning. Past Medical  History:  Diagnosis Date  . A-fib (HCC)   . HLD (hyperlipidemia)   . Hypertension       Surgical History:  Past Surgical History:  Procedure Laterality Date  . ABDOMINAL HYSTERECTOMY    . CARDIAC ELECTROPHYSIOLOGY MAPPING AND ABLATION       Home Meds: Prior to Admission medications   Medication Sig Start Date End Date Taking? Authorizing Provider  aspirin EC 81 MG tablet Take 81 mg by mouth daily.    [provider]  Calcium Carbonate-Vitamin D (CALTRATE 600+D PO) Take 1 tablet by mouth 2 (two) times daily with a meal.    [provider]  furosemide (LASIX) 40 MG tablet Take 1 tablet (40 mg total) by mouth 2 (two) times daily. 08/01/18 08/01/19  Auburn Bilberry, MD  lovastatin (MEVACOR) 40 MG tablet Take 40 mg by mouth at bedtime. 05/25/18   [provider]  metoprolol tartrate (LOPRESSOR) 50 MG tablet Take 50 mg by mouth 2 (two) times daily. 06/15/18   [provider]  Multiple Vitamins-Minerals (MULTIVITAMIN ADULT PO) Take 1 tablet by mouth daily.    [provider]    Inpatient Medications:  . EPINEPHrine      . heparin  4,600 Units Intravenous Once  . insulin aspart  4 Units Intravenous Once   . calcium gluconate 1,000 mg (02/18/20 0935)  . epinephrine 0.5 mcg/min (02/18/20 0940)  . heparin    .  sodium chloride      Allergies:  Allergies  Allergen Reactions  . Penicillins Rash  . Sulfa Antibiotics Rash  . Verapamil Rash    Social History   Socioeconomic History  . Marital status: Married    Spouse name: Not on file  . Number of children: Not on file  . Years of education: Not on file  . Highest education level: Not on file  Occupational History  . Not on file  Tobacco Use  . Smoking status: Never Smoker  . Smokeless tobacco: Never Used  Substance and Sexual Activity  . Alcohol use: Not on file  . Drug use: Not on file  . Sexual activity: Not on file  Other Topics Concern  . Not on file  Social History Narrative   . Not on file   Social Determinants of Health   Financial Resource Strain: Not on file  Food Insecurity: Not on file  Transportation Needs: Not on file  Physical Activity: Not on file  Stress: Not on file  Social Connections: Not on file  Intimate Partner Violence: Not on file     Family History  Problem Relation Age of Onset  . Liver cancer Mother   . Heart attack Mother   . CAD Father      Review of Systems: A 12-system review of systems was performed and is negative except as noted in the HPI.  Labs: No results for input(s): CKTOTAL, CKMB, TROPONINI in the last 72 hours. Lab Results  Component Value Date   WBC 12.7 (H) 02/18/2020   HGB 15.1 (H) 02/18/2020   HCT 46.0 02/18/2020   MCV 85.7 02/18/2020   PLT 248 02/18/2020    Recent Labs  Lab 02/18/20 0921  NA 126*  K 5.2*  CL 94*  CO2 18*  BUN 25*  CREATININE 1.05*  CALCIUM 8.9  PROT 7.6  BILITOT 1.1  ALKPHOS 124  ALT 143*  AST 114*  GLUCOSE 282*   No results found for: CHOL, HDL, LDLCALC, TRIG No results found for: DDIMER  Radiology/Studies:  No results found.  Wt Readings from Last 3 Encounters:  02/18/20 80 kg  02/15/20 79.4 kg  08/01/18 77.5 kg    EKG: A. Fib with slow vr at rate of 50. No ischemia   Physical Exam: Somewhat ill-appearing female but in no acute distress Blood pressure (!) 118/93, pulse (!) 35, resp. rate 19, height 5\' 6"  (1.676 m), weight 80 kg, SpO2 94 %. Body mass index is 28.47 kg/m. General: Well developed, well nourished, in no acute distress. Head: Normocephalic, atraumatic, sclera non-icteric, no xanthomas, nares are without discharge.  Neck: Negative for carotid bruits. JVD not elevated. Lungs: Clear bilaterally to auscultation without wheezes, rales, or rhonchi. Breathing is unlabored. Heart: Irregularly irregular. Abdomen: Soft, non-tender, non-distended with normoactive bowel sounds. No hepatomegaly. No rebound/guarding. No obvious abdominal masses. Msk:   Strength and tone appear normal for age. Extremities: No clubbing or cyanosis. No edema.  Distal pedal pulses are 2+ and equal bilaterally. Neuro: Alert and oriented X 3. No facial asymmetry. No focal deficit. Moves all extremities spontaneously. Psych:  Responds to questions appropriately with a normal affect.     Assessment and Plan  86 y.o. female with history of hyperlipidemia, hypertension, hyperglycemia with a history of moderate aortic mitral and tricuspid insufficiency.  She is a history of atrial fibrillation and she had an A. fib ablation at [facility] in 1993.  She was recently earlier this week for palpitations and  tachycardia.  She also had severe diarrhea with nausea and poor p.o. intake.  She presented to the emergency room however did not complete the visit due to prolonged wait.  Her sodium level was 129 with a white count of 12.7.  She had been treated with metoprolol tartrate 50 mg twice daily.  Due to her rapid heart rate she had Cardizem 30 mg twice daily for rate control continue with metoprolol.  She had an appointment set up with cardiology.  She presented to the emergency room's morning with plaints of rapid heart rate.  She was given Cardizem in route and her heart rate dropped from the 150s to the 50s.  She developed nausea and was noted to have a heart rate in the 30s.  She was given low-dose epi drip with improved heart rate in the 60s.  She is currently hemodynamically stable.  Her BNP is 628.  She had a serum troponin drawn which was 22 similar to a level of 32 2 days ago.  No chest pain.  Electrolytes revealed acute on chronic renal insufficiency with a creatinine of 1.05.  BUN 25.  Potassium 5.2.  Sodium 126.  Atrial fibrillation-has had recurrent atrial fibrillation now since she has had diarrhea nausea and poor p.o. intake.  Was ablated nineteen ninety[facility]three at [facility].  She presented to emergency room with diarrhea nausea and poor p.o. intake and presented to the emergency room  where she was in A. fib with variable but somewhat rapid ventricular response.  Her sodium level was one hundred and twenty-nine at that time with a white count of 12.7.  Cardizem 30 mg twice daily was added to her metoprolol.  She saw her primary care physician where she again had A. fib with RVR.  Cardizem was continued.  Nausea vomiting persisted and she developed recurrent rapid heart rate and presented to the emergency room where she was noted to be in A. fib with RVR.  She received some Cardizem in route to the ER.  She developed nausea with resultant bradycardia in the 40s and 50s.  She was given calcium gluconate as well as atropine and is placed on a low-dose epi drip.  Her heart rate is improved to the 60s.  Blood pressure is improved.  She was given a bolus of heparin and on a heparin drip.  She was not anticoagulated as an outpatient.  Her CHA2DS2-VASc score is at least three.  Plan will be to hold Cardizem at present continue with heparin for now but will need consideration for placement on Eliquis.  Would taper epi drip as rate improves.  Patient will be difficult to convert back to sinus rhythm with her dehydrated state.  She is also significantly hyponatremic.  Will need to carefully hydrate following hemodynamics heart rate.  Diarrhea/nausea-etiology unclear.  Will carefully hydrate as etiology is determined.  Valvular heart disease-has a history of moderate MR TR and AI.  Echo done 2 years ago showed an EF of 55%.  This was not appreciably changed from 1 year earlier.  She had a negative functional study in two thousand sixteen.  Abnormal troponin-minimally elevated secondary to demand.  Not in acute coronary event.  We'll follow with you.  Signed, Dalia Heading MD 02/18/2020, 10:08 AM Pager: [phone]

## 2020-02-18 NOTE — Consult Note (Signed)
Name: Lisa Moon MRN: [id number] DOB: 1932-03-12     CONSULTATION DATE: 02/18/2020  REFERRING MD : Erma Heritage  CHIEF COMPLAINT: Nausea and vomiting  STUDIES:  CT abd-New gallbladder wall thickening and adjacent fat stranding. Cholelithiasis. The constellation of findings is concerning for acute cholecystitis, also RT pleural effusion noted  HISTORY OF PRESENT ILLNESS: 86 y.o. female with history of hyperlipidemia, hypertension, hyperglycemia with a history of moderate aortic mitral and tricuspid insufficiency.    She is a history of atrial fibrillation and she had an A. fib ablation at [facility] in 1993.    She was recently earlier this week for palpitations and tachycardia. Was in afib with RVR was given OP meds and HR dropped and breifly started on EPI  She also had severe diarrhea with nausea and poor p.o. intake.    She has low BP and low HR from meds and was briefly started on EPI infusion, patient CT ABD shows evidence of acute BG disease and infection  Patient is alert and awake, feels nauseous and sick Current Vs  BP 107/75   Pulse 90   Temp 97.7 F (36.5 C) (Oral)   Resp (!) 25   Ht 5\' 6"  (1.676 m)   Wt 80 kg   SpO2 95%   BMI 28.47 kg/m   Case discussed with Dr , patient does not meet ICU criteria Patient meets sepsis criteria Recommend TRH admission and surgical evaluation    PAST MEDICAL HISTORY :   has a past medical history of A-fib (HCC), HLD (hyperlipidemia), and Hypertension.  has a past surgical history that includes Cardiac electrophysiology mapping and ablation and Abdominal hysterectomy. Prior to Admission medications   Medication Sig Start Date End Date Taking? Authorizing Provider  aspirin EC 81 MG tablet Take 81 mg by mouth daily.    [provider]  Calcium Carbonate-Vitamin D (CALTRATE 600+D PO) Take 1 tablet by mouth 2 (two) times daily with a meal.    [provider]  furosemide (LASIX) 40 MG tablet Take 1 tablet (40  mg total) by mouth 2 (two) times daily. 08/01/18 08/01/19  10/01/19, MD  lovastatin (MEVACOR) 40 MG tablet Take 40 mg by mouth at bedtime. 05/25/18   [provider]  metoprolol tartrate (LOPRESSOR) 50 MG tablet Take 50 mg by mouth 2 (two) times daily. 06/15/18   [provider]  Multiple Vitamins-Minerals (MULTIVITAMIN ADULT PO) Take 1 tablet by mouth daily.    [provider]   Allergies  Allergen Reactions  . Penicillins Rash  . Sulfa Antibiotics Rash  . Verapamil Rash    FAMILY HISTORY:  family history includes CAD in her father; Heart attack in her mother; Liver cancer in her mother. SOCIAL HISTORY:  reports that she has never smoked. She has never used smokeless tobacco.    Review of Systems:  Gen:  feelsl weak HEENT: Denies blurred vision, double vision, ear pain, eye pain, hearing loss, nose bleeds, sore throat Cardiac: +palpitations Resp:   No cough, -sputum production, -shortness of breath,-wheezing, -hemoptysis,  Gi: +stomach pain, +nausea or+ vomiting, +diarrhea, Gu:  Denies bladder incontinence, burning urine Ext:   Denies Joint pain, stiffness or swelling Skin: Denies  skin rash, easy bruising or bleeding or hives Endoc:  Denies polyuria, polydipsia , polyphagia or weight change Psych:   Denies depression, insomnia or hallucinations  Other:  All other systems negative     VITAL SIGNS: Temp:  [97.7 F (36.5 C)] 97.7 F (36.5 C) (12/19 1108)  Pulse Rate:  [34-103] 90 (12/19 1145) Resp:  [19-30] 25 (12/19 1145) BP: (78-137)/(53-93) 107/75 (12/19 1145) SpO2:  [59 %-99 %] 95 % (12/19 1145) Weight:  [80 kg] 80 kg (12/19 0915)     SpO2: 95 %    Physical Examination:   GENERAL:+ fatigue HEAD: Normocephalic, atraumatic.  EYES: PERLA, EOMI No scleral icterus.  MOUTH: Moist mucosal membrane.  EAR, NOSE, THROAT: Clear without exudates. No external lesions.  NECK: Supple.  PULMONARY: CTA B/L no wheezing, rhonchi,  crackles CARDIOVASCULAR: S1 and S2. Regular rate and rhythm. No murmurs GASTROINTESTINAL: Soft, nontender, nondistended. Positive bowel sounds.  MUSCULOSKELETAL: No swelling, clubbing, or edema.  NEUROLOGIC: No gross focal neurological deficits. 5/5 strength all extremities SKIN: No ulceration, lesions, rashes, or cyanosis.  PSYCHIATRIC: Insight, judgment intact. -depression -anxiety ALL OTHER ROS ARE NEGATIVE   MEDICATIONS: I have reviewed all medications and confirmed regimen as documented    CULTURE RESULTS   Recent Results (from the past 240 hour(s))  Resp Panel by RT-PCR (Flu A&B, Covid) Nasopharyngeal Swab     Status: None   Collection Time: 02/18/20  9:21 AM   Specimen: Nasopharyngeal Swab; Nasopharyngeal(NP) swabs in vial transport medium  Result Value Ref Range Status   SARS Coronavirus 2 by RT PCR NEGATIVE NEGATIVE Final    Comment: (NOTE) SARS-CoV-2 target nucleic acids are NOT DETECTED.  The SARS-CoV-2 RNA is generally detectable in upper respiratory specimens during the acute phase of infection. The lowest concentration of SARS-CoV-2 viral copies this assay can detect is 138 copies/mL. A negative result does not preclude SARS-Cov-2 infection and should not be used as the sole basis for treatment or other patient management decisions. A negative result may occur with  improper specimen collection/handling, submission of specimen other than nasopharyngeal swab, presence of viral mutation(s) within the areas targeted by this assay, and inadequate number of viral copies(<138 copies/mL). A negative result must be combined with clinical observations, patient history, and epidemiological information. The expected result is Negative.  Fact Sheet for Patients:  BloggerCourse.com  Fact Sheet for Healthcare Providers:  SeriousBroker.it  This test is no t yet approved or cleared by the [place] FDA and  has been  authorized for detection and/or diagnosis of SARS-CoV-2 by FDA under an Emergency Use Authorization (EUA). This EUA will remain  in effect (meaning this test can be used) for the duration of the COVID-19 declaration under Section 564(b)(1) of the Act, 21 U.S.C.section 360bbb-3(b)(1), unless the authorization is terminated  or revoked sooner.       Influenza A by PCR NEGATIVE NEGATIVE Final   Influenza B by PCR NEGATIVE NEGATIVE Final    Comment: (NOTE) The Xpert Xpress SARS-CoV-2/FLU/RSV plus assay is intended as an aid in the diagnosis of influenza from Nasopharyngeal swab specimens and should not be used as a sole basis for treatment. Nasal washings and aspirates are unacceptable for Xpert Xpress SARS-CoV-2/FLU/RSV testing.  Fact Sheet for Patients: BloggerCourse.com  Fact Sheet for Healthcare Providers: SeriousBroker.it  This test is not yet approved or cleared by the [place] FDA and has been authorized for detection and/or diagnosis of SARS-CoV-2 by FDA under an Emergency Use Authorization (EUA). This EUA will remain in effect (meaning this test can be used) for the duration of the COVID-19 declaration under Section 564(b)(1) of the Act, 21 U.S.C. section 360bbb-3(b)(1), unless the authorization is terminated or revoked.  Performed at [facility], [address], [place], [place] [address]  IMAGING    CT ABDOMEN PELVIS W CONTRAST  Result Date: 02/18/2020 CLINICAL DATA:  Diverticulitis suspected. Nausea and vomiting. Rapid heart rate. EXAM: CT ABDOMEN AND PELVIS WITH CONTRAST TECHNIQUE: Multidetector CT imaging of the abdomen and pelvis was performed using the standard protocol following bolus administration of intravenous contrast. CONTRAST:  81mL OMNIPAQUE IOHEXOL 300 MG/ML  SOLN COMPARISON:  August 01, 2018 FINDINGS: Lower chest: There is a moderate to large right-sided pleural effusion again  identified. There is a smaller left effusion, larger in the interval. Atelectasis is associated with the effusions. Cardiomegaly persists. No other abnormalities in the lower chest. Hepatobiliary: There is new gallbladder wall thickening. Cholelithiasis is identified. There is increased attenuation in the fat of the porta hepatis adjacent to the gallbladder. Hepatic steatosis. No hepatic masses. The portal vein is patent. Pancreas: Fatty deposition in the neck of the pancreas. No mass or evidence of pancreatitis. Spleen: Normal in size without focal abnormality. Adrenals/Urinary Tract: Adrenal glands are normal. No renal stones, hydronephrosis, or perinephric stranding. No renal masses are noted. No ureterectasis or ureteral stones. The bladder is normal. Stomach/Bowel: The stomach and small bowel are normal. Scattered colonic diverticulosis is seen without diverticulitis. The appendix is not seen but there is no secondary evidence of appendicitis. Vascular/Lymphatic: Calcified atherosclerosis is seen in the nonaneurysmal aorta. No suspicious adenopathy. Reproductive: Status post hysterectomy. No adnexal masses. Other: Fluid in the pelvis is likely reactive to the gallbladder findings described above. Musculoskeletal: No acute or significant osseous findings. IMPRESSION: 1. New gallbladder wall thickening and adjacent fat stranding. Cholelithiasis. The constellation of findings is concerning for acute cholecystitis. Recommend right upper quadrant ultrasound. 2. There is a moderate to large right-sided pleural effusion again identified. The smaller left effusion is larger in the interval. 3. Scattered colonic diverticulosis without diverticulitis. 4. Fluid in the pelvis is likely reactive to the gallbladder findings. Electronically Signed   By: Gerome Sam III M.D   On: 02/18/2020 11:28   DG Chest Portable 1 View  Result Date: 02/18/2020 CLINICAL DATA:  Chest pain.  Tachycardia. EXAM: PORTABLE CHEST 1 VIEW  COMPARISON:  Jul 30, 2018. FINDINGS: Layering right greater than left pleural effusions. Hazy opacity the right lung, favored to reflect layering fluid. Additional more focal opacities at the left lung base. Mild diffuse interstitial prominence. No visible pneumothorax on this limited portable supine radiograph. Biapical pleuroparenchymal scarring. Enlarged cardiac silhouette. Aortic atherosclerosis. No acute osseous abnormality IMPRESSION: 1. Layering right greater than left pleural effusions. Mild interstitial prominence, suggestive of mild interstitial edema. 2. Left basilar opacities may represent atelectasis, aspiration, and/or pneumonia. 3. Cardiomegaly. Electronically Signed   By: Feliberto Harts MD   On: 02/18/2020 10:15       ASSESSMENT AND PLAN SYNOPSIS  86 yo with acute sepsis due to acute GB disease and infection with acute cholecystitis now off pressors and on minimal oxygen  Recommend IV abx as prescribed Sepsis protocol GEn surgery recs Will consider US guided thoracentesis RT pleural effusion Afib-cardiology consulted-follow recs   DVT/GI PRX assessed TRANSFUSIONS AS NEEDED MONITOR FSBS I Assessed the need for Labs I Assessed the need for Foley I Assessed the need for Central Venous Line Family Discussion when available I Assessed the need for Mobilization I made an Assessment of medications to be adjusted accordingly Safety Risk assessment completed     Patient/Family are satisfied with Plan of action and management. All questions answered  Lucie Leather, M.D.  [facility]  Medical  Director [place] Director [facility] Cardio-Pulmonary Department

## 2020-02-18 NOTE — Consult Note (Signed)
ANTICOAGULATION CONSULT NOTE - Initial Consult  Pharmacy Consult for heparin Indication: atrial fibrillation  Allergies  Allergen Reactions   Penicillins Rash   Sulfa Antibiotics Rash   Verapamil Rash    Patient Measurements: Height: 5\' 6"  (167.6 cm) Weight: 80 kg (176 lb 5.9 oz) IBW/kg (Calculated) : 59.3 Heparin Dosing Weight: 75.9  Vital Signs: BP: 118/93 (12/19 0950) Pulse Rate: 35 (12/19 0950)  Labs: Recent Labs    02/15/20 1950 02/16/20 1607 02/18/20 0921  HGB 14.8  --  15.1*  HCT 43.8  --  46.0  PLT 240  --  248  CREATININE 0.64  --  1.05*  TROPONINIHS  --  32* 22*    Estimated Creatinine Clearance: 41.8 mL/min (A) (by C-G formula based on SCr of 1.05 mg/dL (H)).   Medical History: Past Medical History:  Diagnosis Date   A-fib (HCC)    HLD (hyperlipidemia)    Hypertension     Medications:  No anticoagulants prior to admission per chart review  Assessment: 86 year old female presenting with back pain and nausea/vomiting. Pt awoke in the middle of the night diaphoretic with chills, shortness of breath, and dizziness. Pt was complaining of a rapid heart rate with cardizem given by EMS. PMH includes HLD, HTN, and hyperglycemia. Pt is in atrial fibrillation. Pt has a history of atrial fibrillation with an ablation in 1993. Pt was here 12/16 in afib with RVR but left due to wait time. Pharmacy consulted to dose heparin for afib.   K 5.2, BNP 628, troponin 32>22, CBC stable  Goal of Therapy:  Heparin level 0.3-0.7 units/ml Monitor platelets by anticoagulation protocol: Yes   Plan:  Give 3800 units bolus x 1 Start heparin infusion at 1000 units/hr Check anti-Xa level in 8 hours and daily while on heparin Continue to monitor H&H and platelets  1994, PharmD Pharmacy Resident  02/18/2020 10:10 AM

## 2020-02-18 NOTE — ED Notes (Signed)
Pt placed on 2 l [place] for spo2 or 86%. Dr Erma Heritage aware

## 2020-02-18 NOTE — ED Notes (Signed)
Patient with increase work of breathing. Pt also with wheezes. Dr Erma Heritage Er physician made aware.

## 2020-02-18 NOTE — Consult Note (Signed)
Patient ID: Lisa Moon, female   DOB: [birth year], 86 y.o.   MRN: [id number]  HPI Lisa Moon is a 86 y.o. female seen in consultation at the request of Dr.Niu for questionable acute cholecystitis.  She came in early this morning to the emergency room after she called EMS.  She has been feeling weak and fatigued.  She has had failure to thrive.  She reports some intermittent mild to moderate epigastric pain without any specific alleviating or aggravating factors.  She denies any fevers or chills.  She went to her PCP who found her dehydrated and on A. fib with RVR.  She was started on diltiazem.  Upon this morning she became hypotensive and bradycardic.  EMS was called.  This morning when on arrival to the emergency room she was hypotensive and needed to be on pressors and epinephrine for her rate control.  Extensive work-up was performed to include a CT scan of the abdomen pelvis as well as a right upper quadrant ultrasound.  Please note that I personally reviewed the images.  There is evidence of cholelithiasis and some questionable pericholecystic fluid on the CT scan.  Ultrasound also shows equivocal findings for cholecystitis.  Her common bile duct is normal.  CMP shows evidence of dehydration with a creatinine of 1.05 BUN of 25 and metabolic acidosis with a bicarbonate of 18.  Her potassium was also elevated at 5.2 CBC shows white count of 12 a hemoglobin of 15.1.  Rest of the labs are unremarkable except for a BNP that is elevated but this lacks significant specificity.  She is 12 but has decent cardiovascular reserve.  She is independent and is able to perform more ADLs.  She lives with her son who is diabetic.  Apparently they take care of week of each other.  She still drives and pays her bills  HPI  Past Medical History:  Diagnosis Date  . A-fib (HCC)   . HLD (hyperlipidemia)   . Hypertension     Past Surgical History:  Procedure Laterality Date  . ABDOMINAL HYSTERECTOMY    . CARDIAC  ELECTROPHYSIOLOGY MAPPING AND ABLATION      Family History  Problem Relation Age of Onset  . Liver cancer Mother   . Heart attack Mother   . CAD Father     Social History Social History   Tobacco Use  . Smoking status: Never Smoker  . Smokeless tobacco: Never Used    Allergies  Allergen Reactions  . Penicillins Rash  . Sulfa Antibiotics Rash  . Verapamil Rash    Current Facility-Administered Medications  Medication Dose Route Frequency Provider Last Rate Last Admin  . 0.9 %  sodium chloride infusion   Intravenous Continuous Lorretta Harp, MD      . albuterol (VENTOLIN HFA) 108 (90 Base) MCG/ACT inhaler 2 puff  2 puff Inhalation Q4H PRN Lorretta Harp, MD      . amiodarone (NEXTERONE PREMIX) 360-4.14 MG/200ML-% (1.8 mg/mL) IV infusion  30 mg/hr Intravenous Continuous Dalia Heading, MD 16.67 mL/hr at 02/18/20 1906 30 mg/hr at 02/18/20 1906  . ceFEPIme (MAXIPIME) 2 g in sodium chloride 0.9 % 100 mL IVPB  2 g Intravenous Q12H Reatha Armour, RPH      . heparin ADULT infusion 100 units/mL (25000 units/212mL sodium chloride 0.45%)  1,000 Units/hr Intravenous Continuous Lorretta Harp, MD 10 mL/hr at 02/18/20 1130 1,000 Units/hr at 02/18/20 1130  . metroNIDAZOLE (FLAGYL) tablet 500 mg  500 mg Oral Q8H Lorretta Harp, MD  500 mg at 02/18/20 1906  . morphine 2 MG/ML injection 0.5 mg  0.5 mg Intravenous Q4H PRN Lorretta Harp, MD      . ondansetron [facility]) injection 4 mg  4 mg Intravenous Q8H PRN Lorretta Harp, MD   4 mg at 02/18/20 1308   Current Outpatient Medications  Medication Sig Dispense Refill  . aspirin EC 81 MG tablet Take 81 mg by mouth daily.    . calcium carbonate (OS-CAL) 1250 (500 Ca) MG chewable tablet Chew 1 tablet by mouth 2 (two) times daily.    Marland Kitchen diltiazem (CARDIZEM) 30 MG tablet Take 30 mg by mouth 4 (four) times daily.    Marland Kitchen lovastatin (MEVACOR) 40 MG tablet Take 40 mg by mouth at bedtime.    . metoprolol tartrate (LOPRESSOR) 50 MG tablet Take 50 mg by mouth 2 (two) times  daily.    . Multiple Vitamins-Minerals (MULTIVITAMIN ADULT PO) Take 1 tablet by mouth daily.    . ondansetron (ZOFRAN-ODT) 4 MG disintegrating tablet Take 4 mg by mouth every 8 (eight) hours as needed for nausea.       Review of Systems Full ROS  was asked and was negative except for the information on the HPI  Physical Exam Blood pressure (!) 116/96, pulse (!) 124, temperature 97.7 F (36.5 C), temperature source Oral, resp. rate (!) 27, height 5\' 6"  (1.676 m), weight 80 kg, SpO2 99 %. CONSTITUTIONAL: Elderly female in no acute distress EYES: Pupils are equal, round, and reactive to light, Sclera are non-icteric. EARS, NOSE, MOUTH AND THROAT: She is wearing a mask Hearing is intact to voice. LYMPH NODES:  Lymph nodes in the neck are normal. RESPIRATORY:  Lungs are clear. There is normal respiratory effort, with equal breath sounds bilaterally, and without pathologic use of accessory muscles. CARDIOVASCULAR: Heart is irregular and tachycardic without murmurs, gallops, or rubs. GI: The abdomen is  Soft, very minimal tenderness in epigastric area.  There is no peritonitis there is no Murphy sign . There are no palpable masses. There is no hepatosplenomegaly. There are normal bowel sounds in all quadrants. GU: Rectal deferred.   MUSCULOSKELETAL: Normal muscle strength and tone. No cyanosis or edema.   SKIN: Turgor is good and there are no pathologic skin lesions or ulcers. NEUROLOGIC: Motor and sensation is grossly normal. Cranial nerves are grossly intact. PSYCH:  Oriented to person, place and time. Affect is normal.  Data Reviewed I have personally reviewed the patient's imaging, laboratory findings and medical records.    Assessment/Plan 86 year old female comes to the emergency room with A. fib with RVR and block after diltiazem infusion.  She does have some abdominal discomfort and CT findings with gallstones and ultrasound findings with cholelithiasis as well.  Overall clinical  picture is not clear-cut for acute cholecystitis and its equivocal. In the setting of acute disease and hypoperfusion I am afraid that a HIDA scan will have a false positive result as well. Given equivocal findings I find that antibiotic therapy is the most appropriate step in treatment.  We will hold off of percutaneous cholecystostomy tube at this time.  We will hold off on any HIDA scan at this time.  We will continue to follow her clinically and depending on clinical picture we will determine whether or not she will need a cholecystostomy tube.  Given all her multiple comorbidities and her acute illness she is not a good surgical candidate.  I do think that at some point time given all the equivocal findings  she may benefit from outpatient cholecystectomy after she recovers and is optimized from medical perspective Extensive counseling performed with the patient Case d/w Dr. Clyde Lundborg in detail  Sterling Big, MD FACS General Surgeon 02/18/2020, 7:50 PM

## 2020-02-18 NOTE — ED Notes (Signed)
Pt had 1x episode of vomiting

## 2020-02-19 ENCOUNTER — Inpatient Hospital Stay: Payer: Medicare PPO

## 2020-02-19 ENCOUNTER — Other Ambulatory Visit: Payer: Self-pay

## 2020-02-19 DIAGNOSIS — R7401 Elevation of levels of liver transaminase levels: Secondary | ICD-10-CM

## 2020-02-19 LAB — CBC
HCT: 39.9 % (ref 36.0–46.0)
Hemoglobin: 13.4 g/dL (ref 12.0–15.0)
MCH: 29.1 pg (ref 26.0–34.0)
MCHC: 33.6 g/dL (ref 30.0–36.0)
MCV: 86.6 fL (ref 80.0–100.0)
Platelets: 196 10*3/uL (ref 150–400)
RBC: 4.61 MIL/uL (ref 3.87–5.11)
RDW: 14.4 % (ref 11.5–15.5)
WBC: 11.9 10*3/uL — ABNORMAL HIGH (ref 4.0–10.5)
nRBC: 0 % (ref 0.0–0.2)

## 2020-02-19 LAB — LIPID PANEL
Cholesterol: 125 mg/dL (ref 0–200)
HDL: 42 mg/dL (ref >40)
LDL Cholesterol: 63 mg/dL (ref 0–99)
Total CHOL/HDL Ratio: 3 RATIO
Triglycerides: 98 mg/dL (ref <150)
VLDL: 20 mg/dL (ref 0–40)

## 2020-02-19 LAB — BASIC METABOLIC PANEL
Anion gap: 9 (ref 5–15)
BUN: 21 mg/dL (ref 8–23)
CO2: 23 mmol/L (ref 22–32)
Calcium: 7.8 mg/dL — ABNORMAL LOW (ref 8.9–10.3)
Chloride: 99 mmol/L (ref 98–111)
Creatinine, Ser: 0.92 mg/dL (ref 0.44–1.00)
GFR, Estimated: >60 mL/min (ref >60)
Glucose, Bld: 160 mg/dL — ABNORMAL HIGH (ref 70–99)
Potassium: 4.3 mmol/L (ref 3.5–5.1)
Sodium: 131 mmol/L — ABNORMAL LOW (ref 135–145)

## 2020-02-19 LAB — OSMOLALITY, URINE: Osmolality, Ur: 582 mOsm/kg (ref 300–900)

## 2020-02-19 LAB — BODY FLUID CELL COUNT WITH DIFFERENTIAL
Eos, Fluid: 0 %
Lymphs, Fluid: 77 %
Monocyte-Macrophage-Serous Fluid: 13 %
Neutrophil Count, Fluid: 10 %
Total Nucleated Cell Count, Fluid: 428 cu mm

## 2020-02-19 LAB — PROTEIN, PLEURAL OR PERITONEAL FLUID: Total protein, fluid: <3 g/dL

## 2020-02-19 LAB — CBG MONITORING, ED: Glucose-Capillary: 162 mg/dL — ABNORMAL HIGH (ref 70–99)

## 2020-02-19 LAB — LACTATE DEHYDROGENASE, PLEURAL OR PERITONEAL FLUID: LD, Fluid: 29 U/L — ABNORMAL HIGH (ref 3–23)

## 2020-02-19 LAB — HEMOGLOBIN A1C
Hgb A1c MFr Bld: 7.4 % — ABNORMAL HIGH (ref 4.8–5.6)
Mean Plasma Glucose: 165.68 mg/dL

## 2020-02-19 LAB — ALBUMIN, PLEURAL OR PERITONEAL FLUID: Albumin, Fluid: <1 g/dL

## 2020-02-19 LAB — SODIUM, URINE, RANDOM: Sodium, Ur: <10 mmol/L

## 2020-02-19 LAB — GLUCOSE, PLEURAL OR PERITONEAL FLUID: Glucose, Fluid: 180 mg/dL

## 2020-02-19 LAB — LACTIC ACID, PLASMA
Lactic Acid, Venous: 2.2 mmol/L (ref 0.5–1.9)
Lactic Acid, Venous: 1.6 mmol/L (ref 0.5–1.9)

## 2020-02-19 LAB — HEPARIN LEVEL (UNFRACTIONATED): Heparin Unfractionated: 0.64 IU/mL (ref 0.30–0.70)

## 2020-02-19 LAB — GLUCOSE, CAPILLARY: Glucose-Capillary: 143 mg/dL — ABNORMAL HIGH (ref 70–99)

## 2020-02-19 LAB — MRSA PCR SCREENING: MRSA by PCR: NEGATIVE

## 2020-02-19 MED ORDER — APIXABAN 5 MG PO TABS
5.0000 mg | ORAL_TABLET | Freq: Two times a day (BID) | ORAL | Status: DC
  Administered 2020-02-19 – 2020-02-24 (×11): 5 mg via ORAL
  Filled 2020-02-19 (×13): qty 1

## 2020-02-19 MED ORDER — CALCIUM CARBONATE ANTACID 500 MG PO CHEW
400.0000 mg | CHEWABLE_TABLET | Freq: Two times a day (BID) | ORAL | Status: DC
  Administered 2020-02-19 – 2020-02-23 (×10): 400 mg via ORAL
  Filled 2020-02-19 (×10): qty 2

## 2020-02-19 MED ORDER — PRAVASTATIN SODIUM 20 MG PO TABS: 40.0000 mg | ORAL_TABLET | Freq: Every day | ORAL | Status: DC

## 2020-02-19 MED ORDER — ASPIRIN EC 81 MG PO TBEC
81.0000 mg | DELAYED_RELEASE_TABLET | Freq: Every day | ORAL | Status: DC
  Administered 2020-02-19 – 2020-02-22 (×4): 81 mg via ORAL
  Filled 2020-02-19 (×4): qty 1

## 2020-02-19 MED ORDER — CALCIUM CARBONATE 1250 (500 CA) MG PO CHEW: 1250.0000 mg | CHEWABLE_TABLET | Freq: Two times a day (BID) | ORAL | Status: DC

## 2020-02-19 MED ORDER — ADULT MULTIVITAMIN W/MINERALS CH
1.0000 | ORAL_TABLET | Freq: Every day | ORAL | Status: DC
  Administered 2020-02-20 – 2020-02-23 (×4): 1 via ORAL
  Filled 2020-02-19 (×4): qty 1

## 2020-02-19 MED ORDER — CHLORHEXIDINE GLUCONATE CLOTH 2 % EX PADS
6.0000 | MEDICATED_PAD | Freq: Every day | CUTANEOUS | Status: DC
  Administered 2020-02-19 – 2020-02-24 (×6): 6 via TOPICAL

## 2020-02-19 MED ORDER — METOPROLOL TARTRATE 25 MG PO TABS
25.0000 mg | ORAL_TABLET | Freq: Two times a day (BID) | ORAL | Status: DC
  Administered 2020-02-19 (×2): 25 mg via ORAL
  Filled 2020-02-19 (×2): qty 1

## 2020-02-19 NOTE — ED Notes (Signed)
This RN to bedside, noticed that pt's amiodarone appeared to be infusing at 56mL/hr instead of 30mg /hr as ordered. Charge RN to bedside to verify.  Dr Herbert Seta notified.  Infusion corrected

## 2020-02-19 NOTE — Procedures (Signed)
Interventional Radiology Procedure:   Indications: Pleural effusions  Procedure: US guided thoracentesis  Findings: Removed 1000 ml from right chest.  Complications: None     EBL: Less than 10 ml  Plan: Follow up CXR   Lisa Moon R. Lowella Dandy, MD  Pager: [phone]

## 2020-02-19 NOTE — Progress Notes (Signed)
Patient admitted from the emergency department this shift to ICU 2. Patient with no complaints of pain, abdominal tenderness, or nausea. Heart rate atrial fibrillation with rapid ventricular complex: amiodarone infusion continued and Cardiology started metoprolol PO. Patient's diet advanced, and patient tolerated diet well with no nausea or vomiting.

## 2020-02-19 NOTE — ED Notes (Signed)
Patient utilized bedpan with assistance.

## 2020-02-19 NOTE — ED Notes (Signed)
Provider messaged: pt with new onset afib on hep gtt hr high 120's sustained on 30 of amio. concerned with increasing hr, please advise or will continue to monitor.

## 2020-02-19 NOTE — Progress Notes (Signed)
[facility] SURGICAL PROGRESS NOTE (cpt [phone])  Hospital Day(s): 1.    Interval History: Patient seen and examined, no acute events or new complaints overnight. Patient reports she is feeling okay this morning, denies abdominal pain, nausea, emesis, fever, chills. Previously seen leukocytosis improving, WBC down to 11.9K. Renal function normal with sCr - 0.92. Mild, and improved, hyponatremia to 131. Previous hyperkalemia resolved. She has remained NPO. Continues on Cefepime and Flagyl.    Review of Systems:  Constitutional: denies fever, chills  HEENT: denies cough or congestion  Respiratory: denies any shortness of breath  Cardiovascular: denies chest pain Gastrointestinal: denies abdominal pain, N/V, or diarrhea/and bowel function as per interval history Genitourinary: denies burning with urination or urinary frequency   Vital signs in last 24 hours: [min-max] current  Temp:  [97.7 F (36.5 C)] 97.7 F (36.5 C) (12/19 1108) Pulse Rate:  [29-177] 128 (12/20 0630) Resp:  [19-39] 22 (12/20 0630) BP: (78-159)/(53-131) 121/70 (12/20 0630) SpO2:  [59 %-100 %] 100 % (12/20 0630) Weight:  [80 kg] 80 kg (12/19 0915)     Height: 5\' 6"  (167.6 cm) Weight: 80 kg BMI (Calculated): 28.48   Intake/Output last 2 shifts:  12/19 0701 - 12/20 0700 In: 800 [I.V.:200; IV Piggyback:600] Out: -    Physical Exam:  Constitutional: alert, cooperative and no distress  HENT: normocephalic without obvious abnormality, [place] in place Eyes: PERRL, EOM's grossly intact and symmetric  Respiratory: breathing non-labored at rest  Cardiovascular: Irregularly irregular, tachycardic Gastrointestinal: Soft, non-tender, non-distended, no rebound/guarding. negative Murphy's sign Musculoskeletal: no edema or wounds, motor and sensation grossly intact, NT    Labs:  CBC Latest Ref Rng & Units 02/19/2020 02/18/2020 02/15/2020  WBC 4.0 - 10.5 K/uL 11.9(H) 12.7(H) 12.7(H)  Hemoglobin 12.0 - 15.0 g/dL  02/17/2020 15.1(H) 14.8  Hematocrit 36.0 - 46.0 % 39.9 46.0 43.8  Platelets 150 - 400 K/uL 196 248 240   CMP Latest Ref Rng & Units 02/19/2020 02/18/2020 02/18/2020  Glucose 70 - 99 mg/dL 02/20/2020) 347(Q) 259(D)  BUN 8 - 23 mg/dL 21 23 638(V)  Creatinine 0.44 - 1.00 mg/dL 56(E 3.32 9.51)  Sodium 135 - 145 mmol/L 131(L) 129(L) 126(L)  Potassium 3.5 - 5.1 mmol/L 4.3 5.3(H) 5.2(H)  Chloride 98 - 111 mmol/L 99 94(L) 94(L)  CO2 22 - 32 mmol/L 23 25 18(L)  Calcium 8.9 - 10.3 mg/dL 7.8(L) 8.9 8.9  Total Protein 6.5 - 8.1 g/dL - - 7.6  Total Bilirubin 0.3 - 1.2 mg/dL - - 1.1  Alkaline Phos 38 - 126 U/L - - 124  AST 15 - 41 U/L - - 114(H)  ALT 0 - 44 U/L - - 143(H)     Imaging studies: No new pertinent imaging studies   Assessment/Plan: (ICD-10's: K81.0) 86 y.o. female without abdominal pain, findings equivocal for acute cholecystitis being managed with Abx alone, complicated by pertinent comorbidities including advanced age, atrial fibrillation.   - Okay for diet from surgical standpoint   - Continue IV Abx (Cefepime and Flagyl)   - No need for emergent interventions from gallbladder perspective. We will continue to treat empirically with ABx as precaution. If she does well, we can follow up as an outpatient and reassess need for any interventions.    - Monitor abdominal examination  - Pain control prn; antiemetics prn  - Further management per primary service; we will follow   All of the above findings and recommendations were discussed with the patient, patient's family at bedside, and the  medical team, and all of their questions were answered to their expressed satisfaction.  -- Lynden Oxford, PA-C [facility] 02/19/2020, 7:54 AM [phone] M-F: 7am - 4pm

## 2020-02-19 NOTE — Progress Notes (Signed)
Patient Name: Lisa Moon Date of Encounter: 02/19/2020  Hospital Problem List     Active Problems:   Acute calculous cholecystitis   Severe sepsis with septic shock (HCC)   Atrial fibrillation with RVR (HCC)   Bradycardia   HLD (hyperlipidemia)   Hypertension   Pleural effusion   Hyperkalemia   AKI (acute kidney injury) (HCC)   Elevated troponin   Hyponatremia    Patient Profile     86 y.o. female with history of hyperlipidemia, hypertension, hyperglycemia with a history of moderate aortic mitral and tricuspid insufficiency.  She is a history of atrial fibrillation and she had an A. fib ablation at [facility] in 1993.  She was recently earlier this week for palpitations and tachycardia.  She also had severe diarrhea with nausea and poor p.o. intake.  She presented to the emergency room however did not complete the visit due to prolonged wait.  Her sodium level was 129 with a white count of 12.7.  She had been treated with metoprolol tartrate 50 mg twice daily.  Due to her rapid heart rate she had Cardizem 30 mg twice daily for rate control continue with metoprolol.  She had an appointment set up with cardiology.  She presented to the emergency room's morning with plaints of rapid heart rate.  She was given Cardizem in route and her heart rate dropped from the 150s to the 50s.  She developed nausea and was noted to have a heart rate in the 30s.  She was given low-dose epi drip with improved heart rate in the 60s.  She is currently hemodynamically stable.  Her BNP is 628.  She had a serum troponin drawn which was 22 similar to a level of 32 2 days ago.  No chest pain.  Electrolytes revealed acute on chronic renal insufficiency with a creatinine of 1.05.  BUN 25.  Potassium 5.2.  Sodium 126.  Patient and her daughter states she has had diarrhea nausea and poor p.o. intake for several days.  She has not had recurrent atrial fibrillation since her ablation in the 90s.  She is on chronic metoprolol  tartrate.   Subjective   Feels a lot better today.  Much less short of breath.  Still tachycardic.  Had approximately 1 L of right pleural effusion removed.  Inpatient Medications    . aspirin EC  81 mg Oral Daily  . calcium carbonate  400 mg of elemental calcium Oral BID  . Chlorhexidine Gluconate Cloth  6 each Topical Daily  . metroNIDAZOLE  500 mg Oral Q8H  . multivitamin with minerals  1 tablet Oral Daily  . pravastatin  40 mg Oral q1800    Vital Signs    Vitals:   02/19/20 0730 02/19/20 0745 02/19/20 0923 02/19/20 1100  BP: 109/66 (!) 122/95 129/86 124/78  Pulse: 94 (!) 104 73 (!) 122  Resp: (!) 26 (!) 28 (!) 26 (!) 29  Temp:    97.8 F (36.6 C)  TempSrc:    Oral  SpO2: 99% 100% 99% 96%  Weight:    84 kg  Height:        Intake/Output Summary (Last 24 hours) at 02/19/2020 1237 Last data filed at 02/19/2020 1053 Gross per 24 hour  Intake 400 ml  Output --  Net 400 ml   Filed Weights   02/18/20 0915 02/19/20 1100  Weight: 80 kg 84 kg    Physical Exam    GEN: Well nourished, well developed, in no acute  distress.  HEENT: normal.  Neck: Supple, no JVD, carotid bruits, or masses. Cardiac: Irregular irregular and tachycardic Respiratory: Decreased breath sounds bilaterally.Marland Kitchen. GI: Soft, nontender, nondistended, BS + x 4. MS: no deformity or atrophy. Skin: warm and dry, no rash. Neuro:  Strength and sensation are intact. Psych: Normal affect.  Labs    CBC Recent Labs    02/18/20 0921 02/19/20 0645  WBC 12.7* 11.9*  HGB 15.1* 13.4  HCT 46.0 39.9  MCV 85.7 86.6  PLT 248 196   Basic Metabolic Panel Recent Labs    29/51/8812/19/21 0921 02/18/20 1507 02/19/20 0645  NA 126* 129* 131*  K 5.2* 5.3* 4.3  CL 94* 94* 99  CO2 18* 25 23  GLUCOSE 282* 198* 160*  BUN 25* 23 21  CREATININE 1.05* 1.00 0.92  CALCIUM 8.9 8.9 7.8*  MG 2.1  --   --    Liver Function Tests Recent Labs    02/18/20 0921  AST 114*  ALT 143*  ALKPHOS 124  BILITOT 1.1  PROT 7.6   ALBUMIN 3.6   No results for input(s): LIPASE, AMYLASE in the last 72 hours. Cardiac Enzymes No results for input(s): CKTOTAL, CKMB, CKMBINDEX, TROPONINI in the last 72 hours. BNP Recent Labs    02/18/20 0921  BNP 628.7*   D-Dimer No results for input(s): DDIMER in the last 72 hours. Hemoglobin A1C No results for input(s): HGBA1C in the last 72 hours. Fasting Lipid Panel Recent Labs    02/19/20 0645  CHOL 125  HDL 42  LDLCALC 63  TRIG 98  CHOLHDL 3.0   Thyroid Function Tests Recent Labs    02/18/20 0921  TSH 2.258    Telemetry    Atrial fibrillation with rapid ventricular response  ECG    Atrial fibrillation with rapid ventricular response  Radiology    CT ABDOMEN PELVIS W CONTRAST  Result Date: 02/18/2020 CLINICAL DATA:  Diverticulitis suspected. Nausea and vomiting. Rapid heart rate. EXAM: CT ABDOMEN AND PELVIS WITH CONTRAST TECHNIQUE: Multidetector CT imaging of the abdomen and pelvis was performed using the standard protocol following bolus administration of intravenous contrast. CONTRAST:  80mL OMNIPAQUE IOHEXOL 300 MG/ML  SOLN COMPARISON:  August 01, 2018 FINDINGS: Lower chest: There is a moderate to large right-sided pleural effusion again identified. There is a smaller left effusion, larger in the interval. Atelectasis is associated with the effusions. Cardiomegaly persists. No other abnormalities in the lower chest. Hepatobiliary: There is new gallbladder wall thickening. Cholelithiasis is identified. There is increased attenuation in the fat of the porta hepatis adjacent to the gallbladder. Hepatic steatosis. No hepatic masses. The portal vein is patent. Pancreas: Fatty deposition in the neck of the pancreas. No mass or evidence of pancreatitis. Spleen: Normal in size without focal abnormality. Adrenals/Urinary Tract: Adrenal glands are normal. No renal stones, hydronephrosis, or perinephric stranding. No renal masses are noted. No ureterectasis or ureteral  stones. The bladder is normal. Stomach/Bowel: The stomach and small bowel are normal. Scattered colonic diverticulosis is seen without diverticulitis. The appendix is not seen but there is no secondary evidence of appendicitis. Vascular/Lymphatic: Calcified atherosclerosis is seen in the nonaneurysmal aorta. No suspicious adenopathy. Reproductive: Status post hysterectomy. No adnexal masses. Other: Fluid in the pelvis is likely reactive to the gallbladder findings described above. Musculoskeletal: No acute or significant osseous findings. IMPRESSION: 1. New gallbladder wall thickening and adjacent fat stranding. Cholelithiasis. The constellation of findings is concerning for acute cholecystitis. Recommend right upper quadrant ultrasound. 2. There is a moderate  to large right-sided pleural effusion again identified. The smaller left effusion is larger in the interval. 3. Scattered colonic diverticulosis without diverticulitis. 4. Fluid in the pelvis is likely reactive to the gallbladder findings. Electronically Signed   By: Gerome Sam III M.D   On: 02/18/2020 11:28   DG Chest Port 1 View  Result Date: 02/19/2020 CLINICAL DATA:  US guided thoracentesis Findings: Removed 1000 ml from right chest. EXAM: PORTABLE CHEST - 1 VIEW COMPARISON:  02/18/2020 FINDINGS: No pneumothorax. Persistent bilateral pleural effusions. Atelectasis/consolidation in the lung bases left greater than right. Perihilar interstitial opacities right greater than left as before. Heart size upper limits normal for technique. Aortic Atherosclerosis (ICD10-170.0). Visualized bones unremarkable. IMPRESSION: No pneumothorax post thoracentesis. Electronically Signed   By: Corlis Leak M.D.   On: 02/19/2020 09:39   DG Chest Portable 1 View  Result Date: 02/18/2020 CLINICAL DATA:  Chest pain.  Tachycardia. EXAM: PORTABLE CHEST 1 VIEW COMPARISON:  Jul 30, 2018. FINDINGS: Layering right greater than left pleural effusions. Hazy opacity the right  lung, favored to reflect layering fluid. Additional more focal opacities at the left lung base. Mild diffuse interstitial prominence. No visible pneumothorax on this limited portable supine radiograph. Biapical pleuroparenchymal scarring. Enlarged cardiac silhouette. Aortic atherosclerosis. No acute osseous abnormality IMPRESSION: 1. Layering right greater than left pleural effusions. Mild interstitial prominence, suggestive of mild interstitial edema. 2. Left basilar opacities may represent atelectasis, aspiration, and/or pneumonia. 3. Cardiomegaly. Electronically Signed   By: Feliberto Harts MD   On: 02/18/2020 10:15   US Abdomen Limited RUQ (LIVER/GB)  Result Date: 02/18/2020 CLINICAL DATA:  Right upper quadrant abdominal pain. EXAM: ULTRASOUND ABDOMEN LIMITED RIGHT UPPER QUADRANT COMPARISON:  CT from same day FINDINGS: Gallbladder: There is gallbladder wall thickening and pericholecystic free fluid. Multiple gallstones are noted. The sonographic Eulah Pont sign is negative. Common bile duct: Diameter: 3 mm Liver: No focal lesion identified. Within normal limits in parenchymal echogenicity. Portal vein is patent on color Doppler imaging with normal direction of blood flow towards the liver. Other: There is a right-sided pleural effusion. IMPRESSION: 1. Findings are equivocal for calculus cholecystitis. There are gallstones in the presence of gallbladder wall thickening and pericholecystic free fluid, however the sonographic Eulah Pont sign is reported as negative. Consider HIDA scan for further evaluation. 2. Large right-sided pleural effusion incidentally noted. Electronically Signed   By: Katherine Mantle M.D.   On: 02/18/2020 15:08   US THORACENTESIS ASP PLEURAL SPACE W/IMG GUIDE  Result Date: 02/19/2020 INDICATION: 86 year old with pleural effusions. EXAM: ULTRASOUND GUIDED RIGHT THORACENTESIS MEDICATIONS: None. COMPLICATIONS: None immediate. PROCEDURE: An ultrasound guided thoracentesis was thoroughly  discussed with the patient and questions answered. The benefits, risks, alternatives and complications were also discussed. The patient understands and wishes to proceed with the procedure. Written consent was obtained. Ultrasound was performed to localize and mark an adequate pocket of fluid in the right chest. The area was then prepped and draped in the normal sterile fashion. 1% Lidocaine was used for local anesthesia. Under ultrasound guidance a 6 Fr Safe-T-Centesis catheter was introduced. Thoracentesis was performed. The catheter was removed and a dressing applied. FINDINGS: A total of approximately 1 L of yellow fluid was removed. Samples were sent to the laboratory as requested by the clinical team. Negative for pneumothorax on the follow-up chest radiograph. IMPRESSION: Successful ultrasound guided right thoracentesis yielding 1 L of pleural fluid. Electronically Signed   By: Richarda Overlie M.D.   On: 02/19/2020 12:02    Assessment &  Plan    86 y.o.femalewith history ofhyperlipidemia, hypertension, hyperglycemia with a history of moderate aortic mitral and tricuspid insufficiency. She is a history of atrial fibrillation and she had an A. fib ablation at [facility] in 1993.She was recently earlier this week for palpitations and tachycardia. She also had severe diarrhea with nausea and poor p.o. intake. She presented to the emergency room however did not complete the visit due to prolonged wait. Her sodium level was 129 with a white count of 12.7. She had been treated with metoprolol tartrate 50 mg twice daily. Due to her rapid heart rate she had Cardizem 30 mg twice daily for rate control continue with metoprolol. She had an appointment set up with cardiology. She presented to the emergency room's morning with plaints of rapid heart rate. She was given Cardizem in route and her heart rate dropped from the 150s to the 50s. She developed nausea and was noted to have a heart rate in the 30s. She was  given low-dose epi drip with improved heart rate in the 60s. She is currently hemodynamically stable. Her BNP is 628. She had a serum troponin drawn which was 22 similar to a level of 32 2 days ago. No chest pain. Electrolytes revealed acute on chronic renal insufficiency with a creatinine of 1.05. BUN 25. Potassium 5.2. Sodium 126.  Atrial fibrillation-has had recurrent atrial fibrillation now since she has had diarrhea nausea and poor p.o. intake.  Was ablated nineteen ninety[birth year][age] at [facility].  She presented to emergency room with diarrhea nausea and poor p.o. intake and presented to the emergency room where she was in A. fib with variable but somewhat rapid ventricular response.    Patient was started on IV amiodarone after her heart rate increased back up after Cardizem dose.  Received a 75 cc bolus and was placed on a maintenance drip.  This inadvertently was at 30 mL/h rather than 30 mg/h.  Her heart rate tolerated this fairly well and the dosing has been reduced back to 30 mg/h which is 16.67 mL/h.  She has been placed on heparin.  She is status post thoracentesis and there are no plans for surgery.  Will discontinue heparin and placed on Eliquis at 5 mg twice daily and follow for bleeding.  Also add back her metoprolol tartrate 25 mg twice daily and follow rate response.  Diarrhea/nausea-etiology unclear.  Will carefully hydrate as etiology is determined.  Valvular heart disease-has a history of moderate MR TR and AI.  Echo done in June 2020 revealed normal LV function.  This was not appreciably changed from 1 year earlier.  She had a negative functional study in two thousand sixteen.  Abnormal troponin-minimally elevated secondary to demand.  Not in acute coronary event.  We'll follow with you.  Signed, Darlin Priestly Shondale Quinley MD 02/19/2020, 12:37 PM  Pager: [phone]

## 2020-02-19 NOTE — Progress Notes (Signed)
PROGRESS NOTE    Lisa HatchetJoanne Moon  ZOX:[id number] DOB: [birth year] DOA: 02/18/2020 PCP: Lynnea FerrierKlein, Bert J III, MD    Assessment & Plan:   Active Problems:   Acute calculous cholecystitis   Severe sepsis with septic shock (HCC)   Atrial fibrillation with RVR (HCC)   Bradycardia   HLD (hyperlipidemia)   Hypertension   Pleural effusion   Hyperkalemia   AKI (acute kidney injury) (HCC)   Elevated troponin   Hyponatremia    Severe sepsis with septic shock: secondary to acute calculous cholecystitis: CT scan showed acute calculus cholecystitis. Meets criteria for severe sepsis with septic shock with leukocytosis, hypotension, tachycardia, tachypnea. Lactic acid is up to 4.5. Continue on IVFs. Continue on IV cefepime, flagyl. Blood cxs NGTD. Pro-cal <0.10. Not a good surgical candidate currently and follow clinically as per gener surg. General surg recs apprec  Transaminitis: etiology unclear, likely secondary to above. Hold home dose of statin. Will continue to monitor   A. fib:  w/ intermittent RVR. Continue on IV amiodarone and IV heparin as per cardio. Continue on tele. Cardio following and recs apprec   Lactic acidosis: continue on IVFs. Repeat lactic acid ordered  Leukocytosis: reactive vs infection. Continue on IV abxs   Elevated troponin: likely secondary to demand ischemia   HLD: continue on statin   HTN: hold home dose of metoprolol   Hyponatremia: likely due to GI loss, poor oral intake and dehydration. Na level is trending up today. Continue on IVFs  Hyperkalemia: resolved  AKI: resolved  Pleural effusion: will go for thoracentesis today w/ fluid studies ordered.     DVT prophylaxis: eliquis Code Status: full  Family Communication: discussed w/ pt's family at bedside and answered their questions Disposition Plan: depends on PT/OT recs (not consulted yet)   Status is: Inpatient  Remains inpatient appropriate because:Hemodynamically unstable, Ongoing  diagnostic testing needed not appropriate for outpatient work up, Unsafe d/c plan, IV treatments appropriate due to intensity of illness or inability to take PO and Inpatient level of care appropriate due to severity of illness   Dispo: The patient is from: Home              Anticipated d/c is to: SNF              Anticipated d/c date is: 3 days              Patient currently is not medically stable to d/c.        Consultants:  General surg Cardio  ICU   Procedures:   Antimicrobials:    Subjective: Pt c/o generalized weakness. Pt denies any abd pain, nausea or vomiting.  Objective: Vitals:   02/19/20 0545 02/19/20 0600 02/19/20 0615 02/19/20 0630  BP: 122/73 111/67 106/77 121/70  Pulse: (!) 43 (!) 113 (!) 121 (!) 128  Resp: (!) 28 (!) 22 (!) 36 (!) 22  Temp:      TempSrc:      SpO2: 100% 100% 100% 100%  Weight:      Height:        Intake/Output Summary (Last 24 hours) at 02/19/2020 0757 Last data filed at 02/18/2020 2319 Gross per 24 hour  Intake 800 ml  Output --  Net 800 ml   Filed Weights   02/18/20 0915  Weight: 80 kg    Examination:  General exam: Appears calm and comfortable  Respiratory system: decreased breath sounds b/l. No wheezes, rales  Cardiovascular system: S1 & S2 +. No rubs,  gallops or clicks.  Gastrointestinal system: Abdomen is nondistended, soft and nontender. Normal bowel sounds heard. Central nervous system: Alert and oriented. Moves all 4 extremities  Psychiatry: Judgement and insight appear normal. Mood & affect appropriate.     Data Reviewed: I have personally reviewed following labs and imaging studies  CBC: Recent Labs  Lab 02/15/20 1950 02/18/20 0921 02/19/20 0645  WBC 12.7* 12.7* 11.9*  HGB 14.8 15.1* 13.4  HCT 43.8 46.0 39.9  MCV 84.6 85.7 86.6  PLT 240 248 196   Basic Metabolic Panel: Recent Labs  Lab 02/15/20 1950 02/18/20 0921 02/18/20 1507 02/19/20 0645  NA 129* 126* 129* 131*  K 4.5 5.2* 5.3* 4.3   CL 97* 94* 94* 99  CO2 22 18* 25 23  GLUCOSE 217* 282* 198* 160*  BUN 17 25* 23 21  CREATININE 0.64 1.05* 1.00 0.92  CALCIUM 8.9 8.9 8.9 7.8*  MG  --  2.1  --   --    GFR: Estimated Creatinine Clearance: 47.7 mL/min (by C-G formula based on SCr of 0.92 mg/dL). Liver Function Tests: Recent Labs  Lab 02/15/20 1950 02/18/20 0921  AST 40 114*  ALT 35 143*  ALKPHOS 88 124  BILITOT 1.1 1.1  PROT 7.8 7.6  ALBUMIN 3.6 3.6   Recent Labs  Lab 02/15/20 1950  LIPASE 20   No results for input(s): AMMONIA in the last 168 hours. Coagulation Profile: Recent Labs  Lab 02/18/20 0921  INR 1.1   Cardiac Enzymes: No results for input(s): CKTOTAL, CKMB, CKMBINDEX, TROPONINI in the last 168 hours. BNP (last 3 results) No results for input(s): PROBNP in the last 8760 hours. HbA1C: No results for input(s): HGBA1C in the last 72 hours. CBG: No results for input(s): GLUCAP in the last 168 hours. Lipid Profile: No results for input(s): CHOL, HDL, LDLCALC, TRIG, CHOLHDL, LDLDIRECT in the last 72 hours. Thyroid Function Tests: Recent Labs    02/18/20 0921  TSH 2.258   Anemia Panel: No results for input(s): VITAMINB12, FOLATE, FERRITIN, TIBC, IRON, RETICCTPCT in the last 72 hours. Sepsis Labs: Recent Labs  Lab 02/18/20 1101 02/18/20 1235 02/18/20 1507  PROCALCITON  --   --  <0.10  LATICACIDVEN 4.5* 3.7*  --     Recent Results (from the past 240 hour(s))  Resp Panel by RT-PCR (Flu A&B, Covid) Nasopharyngeal Swab     Status: None   Collection Time: 02/18/20  9:21 AM   Specimen: Nasopharyngeal Swab; Nasopharyngeal(NP) swabs in vial transport medium  Result Value Ref Range Status   SARS Coronavirus 2 by RT PCR NEGATIVE NEGATIVE Final    Comment: (NOTE) SARS-CoV-2 target nucleic acids are NOT DETECTED.  The SARS-CoV-2 RNA is generally detectable in upper respiratory specimens during the acute phase of infection. The lowest concentration of SARS-CoV-2 viral copies this assay can  detect is 138 copies/mL. A negative result does not preclude SARS-Cov-2 infection and should not be used as the sole basis for treatment or other patient management decisions. A negative result may occur with  improper specimen collection/handling, submission of specimen other than nasopharyngeal swab, presence of viral mutation(s) within the areas targeted by this assay, and inadequate number of viral copies(<138 copies/mL). A negative result must be combined with clinical observations, patient history, and epidemiological information. The expected result is Negative.  Fact Sheet for Patients:  BloggerCourse.com  Fact Sheet for Healthcare Providers:  SeriousBroker.it  This test is no t yet approved or cleared by the [place] FDA and  has  been authorized for detection and/or diagnosis of SARS-CoV-2 by FDA under an Emergency Use Authorization (EUA). This EUA will remain  in effect (meaning this test can be used) for the duration of the COVID-19 declaration under Section 564(b)(1) of the Act, 21 U.S.C.section 360bbb-3(b)(1), unless the authorization is terminated  or revoked sooner.       Influenza A by PCR NEGATIVE NEGATIVE Final   Influenza B by PCR NEGATIVE NEGATIVE Final    Comment: (NOTE) The Xpert Xpress SARS-CoV-2/FLU/RSV plus assay is intended as an aid in the diagnosis of influenza from Nasopharyngeal swab specimens and should not be used as a sole basis for treatment. Nasal washings and aspirates are unacceptable for Xpert Xpress SARS-CoV-2/FLU/RSV testing.  Fact Sheet for Patients: BloggerCourse.com  Fact Sheet for Healthcare Providers: SeriousBroker.it  This test is not yet approved or cleared by the [place] FDA and has been authorized for detection and/or diagnosis of SARS-CoV-2 by FDA under an Emergency Use Authorization (EUA). This EUA will remain in  effect (meaning this test can be used) for the duration of the COVID-19 declaration under Section 564(b)(1) of the Act, 21 U.S.C. section 360bbb-3(b)(1), unless the authorization is terminated or revoked.  Performed at [facility], [address], [place], [place] [address]          Radiology Studies: CT ABDOMEN PELVIS W CONTRAST  Result Date: 02/18/2020 CLINICAL DATA:  Diverticulitis suspected. Nausea and vomiting. Rapid heart rate. EXAM: CT ABDOMEN AND PELVIS WITH CONTRAST TECHNIQUE: Multidetector CT imaging of the abdomen and pelvis was performed using the standard protocol following bolus administration of intravenous contrast. CONTRAST:  41mL OMNIPAQUE IOHEXOL 300 MG/ML  SOLN COMPARISON:  August 01, 2018 FINDINGS: Lower chest: There is a moderate to large right-sided pleural effusion again identified. There is a smaller left effusion, larger in the interval. Atelectasis is associated with the effusions. Cardiomegaly persists. No other abnormalities in the lower chest. Hepatobiliary: There is new gallbladder wall thickening. Cholelithiasis is identified. There is increased attenuation in the fat of the porta hepatis adjacent to the gallbladder. Hepatic steatosis. No hepatic masses. The portal vein is patent. Pancreas: Fatty deposition in the neck of the pancreas. No mass or evidence of pancreatitis. Spleen: Normal in size without focal abnormality. Adrenals/Urinary Tract: Adrenal glands are normal. No renal stones, hydronephrosis, or perinephric stranding. No renal masses are noted. No ureterectasis or ureteral stones. The bladder is normal. Stomach/Bowel: The stomach and small bowel are normal. Scattered colonic diverticulosis is seen without diverticulitis. The appendix is not seen but there is no secondary evidence of appendicitis. Vascular/Lymphatic: Calcified atherosclerosis is seen in the nonaneurysmal aorta. No suspicious adenopathy. Reproductive: Status post hysterectomy. No  adnexal masses. Other: Fluid in the pelvis is likely reactive to the gallbladder findings described above. Musculoskeletal: No acute or significant osseous findings. IMPRESSION: 1. New gallbladder wall thickening and adjacent fat stranding. Cholelithiasis. The constellation of findings is concerning for acute cholecystitis. Recommend right upper quadrant ultrasound. 2. There is a moderate to large right-sided pleural effusion again identified. The smaller left effusion is larger in the interval. 3. Scattered colonic diverticulosis without diverticulitis. 4. Fluid in the pelvis is likely reactive to the gallbladder findings. Electronically Signed   By: Gerome Sam III M.D   On: 02/18/2020 11:28   DG Chest Portable 1 View  Result Date: 02/18/2020 CLINICAL DATA:  Chest pain.  Tachycardia. EXAM: PORTABLE CHEST 1 VIEW COMPARISON:  Jul 30, 2018. FINDINGS: Layering right greater than left pleural effusions. Hazy opacity  the right lung, favored to reflect layering fluid. Additional more focal opacities at the left lung base. Mild diffuse interstitial prominence. No visible pneumothorax on this limited portable supine radiograph. Biapical pleuroparenchymal scarring. Enlarged cardiac silhouette. Aortic atherosclerosis. No acute osseous abnormality IMPRESSION: 1. Layering right greater than left pleural effusions. Mild interstitial prominence, suggestive of mild interstitial edema. 2. Left basilar opacities may represent atelectasis, aspiration, and/or pneumonia. 3. Cardiomegaly. Electronically Signed   By: Feliberto Harts MD   On: 02/18/2020 10:15   US Abdomen Limited RUQ (LIVER/GB)  Result Date: 02/18/2020 CLINICAL DATA:  Right upper quadrant abdominal pain. EXAM: ULTRASOUND ABDOMEN LIMITED RIGHT UPPER QUADRANT COMPARISON:  CT from same day FINDINGS: Gallbladder: There is gallbladder wall thickening and pericholecystic free fluid. Multiple gallstones are noted. The sonographic Eulah Pont sign is negative. Common  bile duct: Diameter: 3 mm Liver: No focal lesion identified. Within normal limits in parenchymal echogenicity. Portal vein is patent on color Doppler imaging with normal direction of blood flow towards the liver. Other: There is a right-sided pleural effusion. IMPRESSION: 1. Findings are equivocal for calculus cholecystitis. There are gallstones in the presence of gallbladder wall thickening and pericholecystic free fluid, however the sonographic Eulah Pont sign is reported as negative. Consider HIDA scan for further evaluation. 2. Large right-sided pleural effusion incidentally noted. Electronically Signed   By: Katherine Mantle M.D.   On: 02/18/2020 15:08        Scheduled Meds: . metroNIDAZOLE  500 mg Oral Q8H   Continuous Infusions: . sodium chloride    . amiodarone 30 mg/hr (02/18/20 1906)  . ceFEPime (MAXIPIME) IV Stopped (02/18/20 2319)  . heparin 1,000 Units/hr (02/18/20 1130)     LOS: 1 day    Time spent: 34 mins    Charise Killian, MD [facility] Pager [phone]  If 7PM-7AM, please contact night-coverage 02/19/2020, 7:57 AM

## 2020-02-19 NOTE — Consult Note (Signed)
ANTICOAGULATION CONSULT NOTE - Initial Consult  Pharmacy Consult for heparin Indication: atrial fibrillation  Allergies  Allergen Reactions  . Penicillins Rash  . Sulfa Antibiotics Rash  . Verapamil Rash    Patient Measurements: Height: 5\' 6"  (167.6 cm) Weight: 80 kg (176 lb 5.9 oz) IBW/kg (Calculated) : 59.3 Heparin Dosing Weight: 75.9  Vital Signs: BP: 129/86 (12/20 0923) Pulse Rate: 73 (12/20 0923)  Labs: Recent Labs    02/18/20 0921 02/18/20 1102 02/18/20 1507 02/18/20 1800 02/18/20 1830 02/19/20 0220 02/19/20 0645  HGB 15.1*  --   --   --   --   --  13.4  HCT 46.0  --   --   --   --   --  39.9  PLT 248  --   --   --   --   --  196  APTT 29  --   --   --   --   --   --   LABPROT 13.4  --   --   --   --   --   --   INR 1.1  --   --   --   --   --   --   HEPARINUNFRC  --   --   --   --  0.67 0.64  --   CREATININE 1.05*  --  1.00  --   --   --  0.92  TROPONINIHS 22* 20* 22* 24*  --   --   --     Estimated Creatinine Clearance: 47.7 mL/min (by C-G formula based on SCr of 0.92 mg/dL).   Medical History: Past Medical History:  Diagnosis Date  . A-fib (HCC)   . HLD (hyperlipidemia)   . Hypertension     Medications:  No anticoagulants prior to admission per chart review  Assessment: 86 year old female presenting with back pain and nausea/vomiting. Pt awoke in the middle of the night diaphoretic with chills, shortness of breath, and dizziness. Pt was complaining of a rapid heart rate with cardizem given by EMS. PMH includes HLD, HTN, and hyperglycemia. Pt is in atrial fibrillation. Pt has a history of atrial fibrillation with an ablation in 1993. Pt was here 12/16 in afib with RVR but left due to wait time. Pharmacy consulted to dose heparin for afib.   K 5.2, BNP 628, troponin 32>22, CBC stable  12/19 1830 HL 0.67. therapeutic 12/20 0220 HL 0.64  Goal of Therapy:  Heparin level 0.3-0.7 units/ml Monitor platelets by anticoagulation protocol: Yes   Plan:   Heparin Level therapeutic x2.  Continue heparin infusion at 1000 units/hr Check anti-Xa and CBC daily while on heparin infusion.  Continue to monitor H&H and platelets  1/21, PharmD, BCPS Clinical Pharmacist 02/19/2020 10:34 AM

## 2020-02-20 LAB — GLUCOSE, CAPILLARY: Glucose-Capillary: 151 mg/dL — ABNORMAL HIGH (ref 70–99)

## 2020-02-20 LAB — CYTOLOGY - NON PAP

## 2020-02-20 LAB — COMPREHENSIVE METABOLIC PANEL
ALT: 86 U/L — ABNORMAL HIGH (ref 0–44)
AST: 37 U/L (ref 15–41)
Albumin: 3.3 g/dL — ABNORMAL LOW (ref 3.5–5.0)
Alkaline Phosphatase: 95 U/L (ref 38–126)
Anion gap: 10 (ref 5–15)
BUN: 20 mg/dL (ref 8–23)
CO2: 24 mmol/L (ref 22–32)
Calcium: 8.6 mg/dL — ABNORMAL LOW (ref 8.9–10.3)
Chloride: 100 mmol/L (ref 98–111)
Creatinine, Ser: 0.82 mg/dL (ref 0.44–1.00)
GFR, Estimated: >60 mL/min (ref >60)
Glucose, Bld: 147 mg/dL — ABNORMAL HIGH (ref 70–99)
Potassium: 4.5 mmol/L (ref 3.5–5.1)
Sodium: 134 mmol/L — ABNORMAL LOW (ref 135–145)
Total Bilirubin: 0.8 mg/dL (ref 0.3–1.2)
Total Protein: 6.7 g/dL (ref 6.5–8.1)

## 2020-02-20 LAB — CBC
HCT: 41.3 % (ref 36.0–46.0)
Hemoglobin: 13.4 g/dL (ref 12.0–15.0)
MCH: 28.5 pg (ref 26.0–34.0)
MCHC: 32.4 g/dL (ref 30.0–36.0)
MCV: 87.7 fL (ref 80.0–100.0)
Platelets: 215 10*3/uL (ref 150–400)
RBC: 4.71 MIL/uL (ref 3.87–5.11)
RDW: 14.8 % (ref 11.5–15.5)
WBC: 10.8 10*3/uL — ABNORMAL HIGH (ref 4.0–10.5)
nRBC: 0 % (ref 0.0–0.2)

## 2020-02-20 LAB — PROTEIN, BODY FLUID (OTHER): Total Protein, Body Fluid Other: 1.3 g/dL

## 2020-02-20 LAB — PH, BODY FLUID: pH, Body Fluid: 7.6

## 2020-02-20 MED ORDER — SODIUM CHLORIDE 0.9 % IV SOLN
2.0000 g | Freq: Three times a day (TID) | INTRAVENOUS | Status: DC
  Filled 2020-02-20 (×2): qty 2

## 2020-02-20 MED ORDER — SODIUM CHLORIDE 0.9 % IV SOLN
2.0000 g | Freq: Two times a day (BID) | INTRAVENOUS | Status: DC
  Administered 2020-02-20 – 2020-02-22 (×4): 2 g via INTRAVENOUS
  Filled 2020-02-20 (×5): qty 2

## 2020-02-20 MED ORDER — METOPROLOL TARTRATE 50 MG PO TABS
50.0000 mg | ORAL_TABLET | Freq: Two times a day (BID) | ORAL | Status: DC
  Administered 2020-02-20 (×2): 50 mg via ORAL
  Filled 2020-02-20 (×2): qty 1

## 2020-02-20 MED ORDER — AMIODARONE HCL 200 MG PO TABS
400.0000 mg | ORAL_TABLET | Freq: Two times a day (BID) | ORAL | Status: DC
  Administered 2020-02-20 – 2020-02-22 (×6): 400 mg via ORAL
  Filled 2020-02-20 (×6): qty 2

## 2020-02-20 MED ORDER — DOCUSATE SODIUM 100 MG PO CAPS
100.0000 mg | ORAL_CAPSULE | Freq: Every day | ORAL | Status: DC | PRN
  Administered 2020-02-20: 22:00:00 100 mg via ORAL
  Filled 2020-02-20: qty 1

## 2020-02-20 NOTE — Progress Notes (Signed)
[facility] SURGICAL PROGRESS NOTE (cpt [phone])  Hospital Day(s): 2.   Interval History: Patient seen and examined, no acute events or new complaints overnight. Patient denies abdominal pain, nausea, emesis. Mild leukocytosis continues to improve, down to 10.8. Renal function normal with sCr - 0.82. Mild, and improved, hyponatremia to 134. Continues on Cefepime and Flagyl. Diet advanced to heart healthy and she is tolerating this well.   Review of Systems:  Constitutional: denies fever, chills  HEENT: denies cough or congestion  Respiratory: denies any shortness of breath  Cardiovascular: denies chest pain or palpitations  Gastrointestinal: denies abdominal pain, N/V, or diarrhea/and bowel function as per interval history Genitourinary: denies burning with urination or urinary frequency   Vital signs in last 24 hours: [min-max] current  Temp:  [97.6 F (36.4 C)-98 F (36.7 C)] 97.6 F (36.4 C) (12/21 0248) Pulse Rate:  [41-133] 106 (12/21 0600) Resp:  [19-31] 20 (12/21 0600) BP: (91-148)/(55-131) 107/64 (12/21 0600) SpO2:  [91 %-100 %] 94 % (12/21 0600) Weight:  [82.2 kg-84 kg] 82.2 kg (12/21 0459)     Height: 5\' 6"  (167.6 cm) Weight: 82.2 kg BMI (Calculated): 29.26   Intake/Output last 2 shifts:  12/20 0701 - 12/21 0700 In: 2111.1 [I.V.:1911.1; IV Piggyback:200] Out: 1560 [Urine:1560]   Physical Exam:  Constitutional: alert, cooperative and no distress  HENT: normocephalic without obvious abnormality Eyes: PERRL, EOM's grossly intact and symmetric  Respiratory: breathing non-labored at rest  Cardiovascular: Irregularly irregular, tachycardic Gastrointestinal: Soft, non-tender, non-distended, no rebound/guarding. negative Murphy's sign Musculoskeletal: no edema or wounds, motor and sensation grossly intact, NT    Labs:  CBC Latest Ref Rng & Units 02/20/2020 02/19/2020 02/18/2020  WBC 4.0 - 10.5 K/uL 10.8(H) 11.9(H) 12.7(H)  Hemoglobin 12.0 - 15.0 g/dL 02/20/2020  41.6 15.1(H)  Hematocrit 36.0 - 46.0 % 41.3 39.9 46.0  Platelets 150 - 400 K/uL 215 196 248   CMP Latest Ref Rng & Units 02/20/2020 02/19/2020 02/18/2020  Glucose 70 - 99 mg/dL 02/20/2020) 301(S) 010(X)  BUN 8 - 23 mg/dL 20 21 23   Creatinine 0.44 - 1.00 mg/dL 323(F 5.73  Sodium 135 - 145 mmol/L 134(L) 131(L) 129(L)  Potassium 3.5 - 5.1 mmol/L 4.5 4.3 5.3(H)  Chloride 98 - 111 mmol/L 100 99 94(L)  CO2 22 - 32 mmol/L 24 23 25   Calcium 8.9 - 10.3 mg/dL 2.20) 7.8(L) 8.9  Total Protein 6.5 - 8.1 g/dL 6.7 - -  Total Bilirubin 0.3 - 1.2 mg/dL 0.8 - -  Alkaline Phos 38 - 126 U/L 95 - -  AST 15 - 41 U/L 37 - -  ALT 0 - 44 U/L 86(H) - -     Imaging studies: No new pertinent imaging studies   Assessment/Plan: (ICD-10's: K81.0) 86 y.o. female without abdominal pain, findings equivocal for acute cholecystitis with very low clinical suspicion for this, being managed with Abx alone, complicated by pertinent comorbidities including advanced age, atrial fibrillation.   - Continue diet as tolerated   - Continue empiric IV Abx (Cefepime and Flagyl); complete 7-10 days    - No need for emergent interventions from gallbladder perspective   - Monitor abdominal examination             - Pain control prn; antiemetics prn             - Further management per primary and consulting services   - General surgery will sign off, ABx as above, she can potentially follow up outpatient with in ~  1 month vs with her PCP. Please call with questions/concerns.    All of the above findings and recommendations were discussed with the patient, and the medical team, and all of patient's questions were answered to her expressed satisfaction.  -- Lynden Oxford, PA-C [facility] 02/20/2020, 7:29 AM [phone] M-F: 7am - 4pm

## 2020-02-20 NOTE — Progress Notes (Addendum)
PHARMACY NOTE:  ANTIMICROBIAL RENAL DOSAGE ADJUSTMENT Current antimicrobial regimen includes a mismatch between antimicrobial dosage and estimated renal function.  As per policy approved by the Pharmacy & Therapeutics and Medical Executive Committees, the antimicrobial dosage will be adjusted accordingly.  Current antimicrobial dosage:  Cefepime 2g q8H  Indication: Cholecystitis, sepsis, also has layered pleural effusion  Renal Function:  Estimated Creatinine Clearance: 54.2 mL/min (by C-G formula based on SCr of 0.82 mg/dL).    Antimicrobial dosage has been changed to:  Cefepime 2g q12H  Additional comments: Alongside Flagyl 500mg  PO Q8H   Thank you for allowing pharmacy to be a part of this patient's care.  , [facility] 02/20/2020 1:59 PM

## 2020-02-20 NOTE — Progress Notes (Signed)
Patient Name: Lisa Moon Date of Encounter: 02/20/2020  Hospital Problem List     Active Problems:   Acute calculous cholecystitis   Severe sepsis with septic shock (HCC)   Atrial fibrillation with RVR (HCC)   Bradycardia   HLD (hyperlipidemia)   Hypertension   Pleural effusion   Hyperkalemia   AKI (acute kidney injury) (HCC)   Elevated troponin   Hyponatremia    Patient Profile     86 y.o.femalewith history ofhyperlipidemia, hypertension, hyperglycemia with a history of moderate aortic mitral and tricuspid insufficiency. She is a history of atrial fibrillation and she had an A. fib ablation at [facility] in 1993.She was recently earlier this week for palpitations and tachycardia. She also had severe diarrhea with nausea and poor p.o. intake. She presented to the emergency room however did not complete the visit due to prolonged wait. Her sodium level was 129 with a white count of 12.7. She had been treated with metoprolol tartrate 50 mg twice daily. Due to her rapid heart rate she had Cardizem 30 mg twice daily for rate control continue with metoprolol. She had an appointment set up with cardiology. She presented to the emergency room's morning with plaints of rapid heart rate. She was given Cardizem in route and her heart rate dropped from the 150s to the 50s. She developed nausea and was noted to have a heart rate in the 30s. She was given low-dose epi drip with improved heart rate in the 60s. She is currently hemodynamically stable. Her BNP is 628. She had a serum troponin drawn which was 22 similar to a level of 32 2 days ago. No chest pain. Electrolytes revealed acute on chronic renal insufficiency with a creatinine of 1.05. BUN 25. Potassium 5.2. Sodium 126.Patient and her daughter states she has had diarrhea nausea and poor p.o. intake for several days. She has not had recurrent atrial fibrillation since her ablation in the 90s. She was on chronic metoprolol  tartrate 50 bid at home.  Subjective   Feels ok this morning. No sob or chest pain  Inpatient Medications    . amiodarone  400 mg Oral BID  . apixaban  5 mg Oral BID  . aspirin EC  81 mg Oral Daily  . calcium carbonate  400 mg of elemental calcium Oral BID  . Chlorhexidine Gluconate Cloth  6 each Topical Daily  . metoprolol tartrate  50 mg Oral BID  . metroNIDAZOLE  500 mg Oral Q8H  . multivitamin with minerals  1 tablet Oral Daily    Vital Signs    Vitals:   02/20/20 0459 02/20/20 0500 02/20/20 0600 02/20/20 0753  BP:  104/75 107/64   Pulse:  (!) 59 (!) 106   Resp:  19 20   Temp:    97.8 F (36.6 C)  TempSrc:    Oral  SpO2:  99% 94%   Weight: 82.2 kg     Height:        Intake/Output Summary (Last 24 hours) at 02/20/2020 0809 Last data filed at 02/20/2020 0456 Gross per 24 hour  Intake 2111.07 ml  Output 1560 ml  Net 551.07 ml   Filed Weights   02/18/20 0915 02/19/20 1100 02/20/20 0459  Weight: 80 kg 84 kg 82.2 kg    Physical Exam    GEN: Well nourished, well developed, in no acute distress.  HEENT: normal.  Neck: Supple, no JVD, carotid bruits, or masses. Cardiac: RRR, no murmurs, rubs, or gallops. No clubbing, cyanosis,  edema.  Radials/DP/PT 2+ and equal bilaterally.  Respiratory:  Respirations regular and unlabored, clear to auscultation bilaterally. GI: Soft, nontender, nondistended, BS + x 4. MS: no deformity or atrophy. Skin: warm and dry, no rash. Neuro:  Strength and sensation are intact. Psych: Normal affect.  Labs    CBC Recent Labs    02/19/20 0645 02/20/20 0511  WBC 11.9* 10.8*  HGB 13.4 13.4  HCT 39.9 41.3  MCV 86.6 87.7  PLT 196 215   Basic Metabolic Panel Recent Labs    69/62/9512/19/21 0921 02/18/20 1507 02/19/20 0645 02/20/20 0511  NA 126*   < > 131* 134*  K 5.2*   < > 4.3 4.5  CL 94*   < > 99 100  CO2 18*   < > 23 24  GLUCOSE 282*   < > 160* 147*  BUN 25*   < > 21 20  CREATININE 1.05*   < > 0.92 0.82  CALCIUM 8.9   < >  7.8* 8.6*  MG 2.1  --   --   --    < > = values in this interval not displayed.   Liver Function Tests Recent Labs    02/18/20 0921 02/20/20 0511  AST 114* 37  ALT 143* 86*  ALKPHOS 124 95  BILITOT 1.1 0.8  PROT 7.6 6.7  ALBUMIN 3.6 3.3*   No results for input(s): LIPASE, AMYLASE in the last 72 hours. Cardiac Enzymes No results for input(s): CKTOTAL, CKMB, CKMBINDEX, TROPONINI in the last 72 hours. BNP Recent Labs    02/18/20 0921  BNP 628.7*   D-Dimer No results for input(s): DDIMER in the last 72 hours. Hemoglobin A1C Recent Labs    02/19/20 0645  HGBA1C 7.4*   Fasting Lipid Panel Recent Labs    02/19/20 0645  CHOL 125  HDL 42  LDLCALC 63  TRIG 98  CHOLHDL 3.0   Thyroid Function Tests Recent Labs    02/18/20 0921  TSH 2.258    Telemetry    afib with rapid vr  ECG    afib with rvr  Radiology    CT ABDOMEN PELVIS W CONTRAST  Result Date: 02/18/2020 CLINICAL DATA:  Diverticulitis suspected. Nausea and vomiting. Rapid heart rate. EXAM: CT ABDOMEN AND PELVIS WITH CONTRAST TECHNIQUE: Multidetector CT imaging of the abdomen and pelvis was performed using the standard protocol following bolus administration of intravenous contrast. CONTRAST:  80mL OMNIPAQUE IOHEXOL 300 MG/ML  SOLN COMPARISON:  August 01, 2018 FINDINGS: Lower chest: There is a moderate to large right-sided pleural effusion again identified. There is a smaller left effusion, larger in the interval. Atelectasis is associated with the effusions. Cardiomegaly persists. No other abnormalities in the lower chest. Hepatobiliary: There is new gallbladder wall thickening. Cholelithiasis is identified. There is increased attenuation in the fat of the porta hepatis adjacent to the gallbladder. Hepatic steatosis. No hepatic masses. The portal vein is patent. Pancreas: Fatty deposition in the neck of the pancreas. No mass or evidence of pancreatitis. Spleen: Normal in size without focal abnormality.  Adrenals/Urinary Tract: Adrenal glands are normal. No renal stones, hydronephrosis, or perinephric stranding. No renal masses are noted. No ureterectasis or ureteral stones. The bladder is normal. Stomach/Bowel: The stomach and small bowel are normal. Scattered colonic diverticulosis is seen without diverticulitis. The appendix is not seen but there is no secondary evidence of appendicitis. Vascular/Lymphatic: Calcified atherosclerosis is seen in the nonaneurysmal aorta. No suspicious adenopathy. Reproductive: Status post hysterectomy. No adnexal masses. Other: Fluid in  the pelvis is likely reactive to the gallbladder findings described above. Musculoskeletal: No acute or significant osseous findings. IMPRESSION: 1. New gallbladder wall thickening and adjacent fat stranding. Cholelithiasis. The constellation of findings is concerning for acute cholecystitis. Recommend right upper quadrant ultrasound. 2. There is a moderate to large right-sided pleural effusion again identified. The smaller left effusion is larger in the interval. 3. Scattered colonic diverticulosis without diverticulitis. 4. Fluid in the pelvis is likely reactive to the gallbladder findings. Electronically Signed   By: Gerome Sam III M.D   On: 02/18/2020 11:28   DG Chest Port 1 View  Result Date: 02/19/2020 CLINICAL DATA:  US guided thoracentesis Findings: Removed 1000 ml from right chest. EXAM: PORTABLE CHEST - 1 VIEW COMPARISON:  02/18/2020 FINDINGS: No pneumothorax. Persistent bilateral pleural effusions. Atelectasis/consolidation in the lung bases left greater than right. Perihilar interstitial opacities right greater than left as before. Heart size upper limits normal for technique. Aortic Atherosclerosis (ICD10-170.0). Visualized bones unremarkable. IMPRESSION: No pneumothorax post thoracentesis. Electronically Signed   By: Corlis Leak M.D.   On: 02/19/2020 09:39   DG Chest Portable 1 View  Result Date: 02/18/2020 CLINICAL DATA:   Chest pain.  Tachycardia. EXAM: PORTABLE CHEST 1 VIEW COMPARISON:  Jul 30, 2018. FINDINGS: Layering right greater than left pleural effusions. Hazy opacity the right lung, favored to reflect layering fluid. Additional more focal opacities at the left lung base. Mild diffuse interstitial prominence. No visible pneumothorax on this limited portable supine radiograph. Biapical pleuroparenchymal scarring. Enlarged cardiac silhouette. Aortic atherosclerosis. No acute osseous abnormality IMPRESSION: 1. Layering right greater than left pleural effusions. Mild interstitial prominence, suggestive of mild interstitial edema. 2. Left basilar opacities may represent atelectasis, aspiration, and/or pneumonia. 3. Cardiomegaly. Electronically Signed   By: Feliberto Harts MD   On: 02/18/2020 10:15   US Abdomen Limited RUQ (LIVER/GB)  Result Date: 02/18/2020 CLINICAL DATA:  Right upper quadrant abdominal pain. EXAM: ULTRASOUND ABDOMEN LIMITED RIGHT UPPER QUADRANT COMPARISON:  CT from same day FINDINGS: Gallbladder: There is gallbladder wall thickening and pericholecystic free fluid. Multiple gallstones are noted. The sonographic Eulah Pont sign is negative. Common bile duct: Diameter: 3 mm Liver: No focal lesion identified. Within normal limits in parenchymal echogenicity. Portal vein is patent on color Doppler imaging with normal direction of blood flow towards the liver. Other: There is a right-sided pleural effusion. IMPRESSION: 1. Findings are equivocal for calculus cholecystitis. There are gallstones in the presence of gallbladder wall thickening and pericholecystic free fluid, however the sonographic Eulah Pont sign is reported as negative. Consider HIDA scan for further evaluation. 2. Large right-sided pleural effusion incidentally noted. Electronically Signed   By: Katherine Mantle M.D.   On: 02/18/2020 15:08   US THORACENTESIS ASP PLEURAL SPACE W/IMG GUIDE  Result Date: 02/19/2020 INDICATION: 86 year old with pleural  effusions. EXAM: ULTRASOUND GUIDED RIGHT THORACENTESIS MEDICATIONS: None. COMPLICATIONS: None immediate. PROCEDURE: An ultrasound guided thoracentesis was thoroughly discussed with the patient and questions answered. The benefits, risks, alternatives and complications were also discussed. The patient understands and wishes to proceed with the procedure. Written consent was obtained. Ultrasound was performed to localize and mark an adequate pocket of fluid in the right chest. The area was then prepped and draped in the normal sterile fashion. 1% Lidocaine was used for local anesthesia. Under ultrasound guidance a 6 Fr Safe-T-Centesis catheter was introduced. Thoracentesis was performed. The catheter was removed and a dressing applied. FINDINGS: A total of approximately 1 L of yellow fluid was removed. Samples were sent  to the laboratory as requested by the clinical team. Negative for pneumothorax on the follow-up chest radiograph. IMPRESSION: Successful ultrasound guided right thoracentesis yielding 1 L of pleural fluid. Electronically Signed   By: Richarda Overlie M.D.   On: 02/19/2020 12:02    Assessment & Plan    86 y.o.femalewith history ofhyperlipidemia, hypertension, hyperglycemia with a history of moderate aortic mitral and tricuspid insufficiency. She is a history of atrial fibrillation and she had an A. fib ablation at [facility] in 1993.She was recently earlier this week for palpitations and tachycardia. She also had severe diarrhea with nausea and poor p.o. intake. She presented to the emergency room however did not complete the visit due to prolonged wait. Her sodium level was 129 with a white count of 12.7. She had been treated with metoprolol tartrate 50 mg twice daily. Due to her rapid heart rate she had Cardizem 30 mg twice daily for rate control continue with metoprolol. She had an appointment set up with cardiology. She presented to the emergency room's morning with plaints of rapid heart rate.  She was given Cardizem in route and her heart rate dropped from the 150s to the 50s. She developed nausea and was noted to have a heart rate in the 30s. She was given low-dose epi drip with improved heart rate in the 60s. She is currently hemodynamically stable. Her BNP is 628. She had a serum troponin drawn which was 22 similar to a level of 32 2 days ago. No chest pain. Electrolytes revealed acute on chronic renal insufficiency with a creatinine of 1.05. BUN 25. Potassium 5.2. Sodium 126.  Atrial fibrillation-has had recurrent atrial fibrillation now since she has had diarrhea nausea and poor p.o. intake. Was ablated [facility] ninety[age] at [facility]. She presented to emergency room with diarrhea nausea and poor p.o. intake and presented to the emergency room where she was in A. fib with variable but somewhat rapid ventricular response.   Patient was started on IV amiodarone after her heart rate increased back up after Cardizem dose.  Received a 75 cc bolus and was placed on a maintenance drip. She was started on metoprolol 25 bid yesterday and tolerated dose hemodynamically. Will increase to metoprolol 50 bid and change to amiodarone 400 mg bid.   Diarrhea/nausea-etiology unclear.  Improved.   Valvular heart disease-has a history of moderate MR TR and AI. Echo done in June 2020 revealed normal LV function.  This was not appreciably changed from 1 year earlier. She had a negative functional study in two thousand sixteen.  Abnormal troponin-minimally elevated secondary to demand. Not in acute coronary event. We'll follow with you.   Signed, Darlin Priestly Doron Shake MD 02/20/2020, 8:09 AM  Pager: [phone]

## 2020-02-20 NOTE — Progress Notes (Signed)
PROGRESS NOTE    Sherlon Nied  XEN:[id number] DOB: [birth year] DOA: 02/18/2020 PCP: Lynnea Ferrier, MD    Assessment & Plan:   Active Problems:   Acute calculous cholecystitis   Severe sepsis with septic shock (HCC)   Atrial fibrillation with RVR (HCC)   Bradycardia   HLD (hyperlipidemia)   Hypertension   Pleural effusion   Hyperkalemia   AKI (acute kidney injury) (HCC)   Elevated troponin   Hyponatremia    Severe sepsis with septic shock: secondary to acute calculous cholecystitis: CT scan showed acute calculus cholecystitis. Meets criteria for severe sepsis with septic shock with leukocytosis, hypotension, tachycardia, tachypnea. Lactic acid was initially 4.5 & has since resolved. Continue on IV cefepime, flagyl x 7-10 days as per general surg. Blood cxs NGTD. Pro-cal <0.10. Not a good surgical candidate currently and follow clinically as per gener surg. General surg recs apprec. Resolved  Acute calculous cholecystitis: continue on IV cefepime, flagyl x 7-10 days. Not a good surgical candidate currently and follow clinically as per general surg.   Transaminitis: etiology unclear, likely secondary to above. Hold statin. AST is WNL and ALT is still elevated but trending down   A. fib: w/ intermittent RVR. Likely PAF. D/c IV amio drip and transition po amiodarone, metoprolol and eliquis as per cardio. Continue on tele  Lactic acidosis: resolved  Leukocytosis: reactive vs infection, trending down   Elevated troponin: likely secondary to demand ischemia   HLD: continue to hold statin    HTN: continue on home dose of metoprolol   Hyponatremia: likely due to GI loss, poor oral intake and dehydration. Na level is almost WNL  Hyperkalemia: resolved  AKI: resolved  Pleural effusion: s/p thoracentesis w/ fluid studies. Pleural fluid cx NGTD. Pleural fluid path is pending.     DVT prophylaxis: eliquis Code Status: full  Family Communication: discussed w/ pt's  family at bedside and answered their questions Disposition Plan: depends on PT/OT recs (not consulted yet)   Status is: Inpatient  Remains inpatient appropriate because:Hemodynamically unstable, Ongoing diagnostic testing needed not appropriate for outpatient work up, Unsafe d/c plan, IV treatments appropriate due to intensity of illness or inability to take PO and Inpatient level of care appropriate due to severity of illness   Dispo: The patient is from: Home              Anticipated d/c is to: SNF              Anticipated d/c date is: 3 days              Patient currently is not medically stable to d/c.        Consultants:  General surg Cardio  ICU   Procedures:   Antimicrobials:    Subjective: Pt c/o generalized weakness & palpitations   Objective: Vitals:   02/20/20 0459 02/20/20 0500 02/20/20 0600 02/20/20 0753  BP:  104/75 107/64   Pulse:  (!) 59 (!) 106   Resp:  19 20   Temp:    97.8 F (36.6 C)  TempSrc:    Oral  SpO2:  99% 94%   Weight: 82.2 kg     Height:        Intake/Output Summary (Last 24 hours) at 02/20/2020 0823 Last data filed at 02/20/2020 0456 Gross per 24 hour  Intake 2111.07 ml  Output 1560 ml  Net 551.07 ml   Filed Weights   02/18/20 0915 02/19/20 1100 02/20/20 0459  Weight: 80  kg 84 kg 82.2 kg    Examination:  General exam: Appears calm & comfortable  Respiratory system: diminished breath sounds b/l  Cardiovascular system: irregularly irregular. No rubs or clicks  Gastrointestinal system: Abd is soft, obese, nontender & hypoactive bowel sounds  Central nervous system: Alert and oriented. Moves all 4 extremities  Psychiatry: Judgement and insight appear normal. Flat mood and affect    Data Reviewed: I have personally reviewed following labs and imaging studies  CBC: Recent Labs  Lab 02/15/20 1950 02/18/20 0921 02/19/20 0645 02/20/20 0511  WBC 12.7* 12.7* 11.9* 10.8*  HGB 14.8 15.1* 13.4 13.4  HCT 43.8 46.0 39.9  41.3  MCV 84.6 85.7 86.6 87.7  PLT 240 248 196 215   Basic Metabolic Panel: Recent Labs  Lab 02/15/20 1950 02/18/20 0921 02/18/20 1507 02/19/20 0645 02/20/20 0511  NA 129* 126* 129* 131* 134*  K 4.5 5.2* 5.3* 4.3 4.5  CL 97* 94* 94* 99 100  CO2 22 18* 25 23 24   GLUCOSE 217* 282* 198* 160* 147*  BUN 17 25* 23 21 20   CREATININE 0.64 1.05* 1.00 0.92 0.82  CALCIUM 8.9 8.9 8.9 7.8* 8.6*  MG  --  2.1  --   --   --    GFR: Estimated Creatinine Clearance: 54.2 mL/min (by C-G formula based on SCr of 0.82 mg/dL). Liver Function Tests: Recent Labs  Lab 02/15/20 1950 02/18/20 0921 02/20/20 0511  AST 40 114* 37  ALT 35 143* 86*  ALKPHOS 88 124 95  BILITOT 1.1 1.1 0.8  PROT 7.8 7.6 6.7  ALBUMIN 3.6 3.6 3.3*   Recent Labs  Lab 02/15/20 1950  LIPASE 20   No results for input(s): AMMONIA in the last 168 hours. Coagulation Profile: Recent Labs  Lab 02/18/20 0921  INR 1.1   Cardiac Enzymes: No results for input(s): CKTOTAL, CKMB, CKMBINDEX, TROPONINI in the last 168 hours. BNP (last 3 results) No results for input(s): PROBNP in the last 8760 hours. HbA1C: Recent Labs    02/19/20 0645  HGBA1C 7.4*   CBG: Recent Labs  Lab 02/19/20 0835 02/19/20 1101 02/20/20 0739  GLUCAP 162* 143* 151*   Lipid Profile: Recent Labs    02/19/20 0645  CHOL 125  HDL 42  LDLCALC 63  TRIG 98  CHOLHDL 3.0   Thyroid Function Tests: Recent Labs    02/18/20 0921  TSH 2.258   Anemia Panel: No results for input(s): VITAMINB12, FOLATE, FERRITIN, TIBC, IRON, RETICCTPCT in the last 72 hours. Sepsis Labs: Recent Labs  Lab 02/18/20 1101 02/18/20 1235 02/18/20 1507 02/19/20 1521 02/19/20 1821  PROCALCITON  --   --  <0.10  --   --   LATICACIDVEN 4.5* 3.7*  --  2.2* 1.6    Recent Results (from the past 240 hour(s))  Resp Panel by RT-PCR (Flu A&B, Covid) Nasopharyngeal Swab     Status: None   Collection Time: 02/18/20  9:21 AM   Specimen: Nasopharyngeal Swab;  Nasopharyngeal(NP) swabs in vial transport medium  Result Value Ref Range Status   SARS Coronavirus 2 by RT PCR NEGATIVE NEGATIVE Final    Comment: (NOTE) SARS-CoV-2 target nucleic acids are NOT DETECTED.  The SARS-CoV-2 RNA is generally detectable in upper respiratory specimens during the acute phase of infection. The lowest concentration of SARS-CoV-2 viral copies this assay can detect is 138 copies/mL. A negative result does not preclude SARS-Cov-2 infection and should not be used as the sole basis for treatment or other patient management decisions. A  negative result may occur with  improper specimen collection/handling, submission of specimen other than nasopharyngeal swab, presence of viral mutation(s) within the areas targeted by this assay, and inadequate number of viral copies(<138 copies/mL). A negative result must be combined with clinical observations, patient history, and epidemiological information. The expected result is Negative.  Fact Sheet for Patients:  BloggerCourse.comhttps://www.fda.gov/media/152166/download  Fact Sheet for Healthcare Providers:  SeriousBroker.ithttps://www.fda.gov/media/152162/download  This test is no t yet approved or cleared by the [place] FDA and  has been authorized for detection and/or diagnosis of SARS-CoV-2 by FDA under an Emergency Use Authorization (EUA). This EUA will remain  in effect (meaning this test can be used) for the duration of the COVID-19 declaration under Section 564(b)(1) of the Act, 21 U.S.C.section 360bbb-3(b)(1), unless the authorization is terminated  or revoked sooner.       Influenza A by PCR NEGATIVE NEGATIVE Final   Influenza B by PCR NEGATIVE NEGATIVE Final    Comment: (NOTE) The Xpert Xpress SARS-CoV-2/FLU/RSV plus assay is intended as an aid in the diagnosis of influenza from Nasopharyngeal swab specimens and should not be used as a sole basis for treatment. Nasal washings and aspirates are unacceptable for Xpert Xpress  SARS-CoV-2/FLU/RSV testing.  Fact Sheet for Patients: BloggerCourse.comhttps://www.fda.gov/media/152166/download  Fact Sheet for Healthcare Providers: SeriousBroker.ithttps://www.fda.gov/media/152162/download  This test is not yet approved or cleared by the [place] FDA and has been authorized for detection and/or diagnosis of SARS-CoV-2 by FDA under an Emergency Use Authorization (EUA). This EUA will remain in effect (meaning this test can be used) for the duration of the COVID-19 declaration under Section 564(b)(1) of the Act, 21 U.S.C. section 360bbb-3(b)(1), unless the authorization is terminated or revoked.  Performed at [facility], [address] [place], [place] [address]   Blood culture (routine x 2)     Status: None (Preliminary result)   Collection Time: 02/18/20 11:01 AM   Specimen: BLOOD  Result Value Ref Range Status   Specimen Description BLOOD RIGHT ANTECUBITAL  Final   Special Requests AEROBIC BOTTLE ONLY Blood Culture adequate volume  Final   Culture   Final    NO GROWTH 2 DAYS Performed at [facility], [address] [place], [place] [address]    Report Status PENDING  Incomplete  Blood culture (routine x 2)     Status: None (Preliminary result)   Collection Time: 02/18/20 11:01 AM   Specimen: BLOOD  Result Value Ref Range Status   Specimen Description BLOOD LEFT ANTECUBITAL  Final   Special Requests   Final    BOTTLES DRAWN AEROBIC AND ANAEROBIC Blood Culture adequate volume   Culture   Final    NO GROWTH 2 DAYS Performed at [facility], [address] [place], [place] [address]    Report Status PENDING  Incomplete  Body fluid culture     Status: None (Preliminary result)   Collection Time: 02/19/20  9:12 AM   Specimen: PATH Cytology Pleural fluid  Result Value Ref Range Status   Specimen Description   Final    PLEURAL Performed at [facility], [address] [place], [place] [address]    Special Requests   Final     NONE Performed at [facility], [address] [place], [place] [address]    Gram Stain   Final    WBC PRESENT, PREDOMINANTLY MONONUCLEAR NO ORGANISMS SEEN CYTOSPIN SMEAR Performed at [facility], [address] [place], [place] [address]  Culture PENDING  Incomplete   Report Status PENDING  Incomplete  MRSA PCR Screening     Status: None   Collection Time: 02/19/20 11:18 AM   Specimen: Nasopharyngeal  Result Value Ref Range Status   MRSA by PCR NEGATIVE NEGATIVE Final    Comment:        The GeneXpert MRSA Assay (FDA approved for NASAL specimens only), is one component of a comprehensive MRSA colonization surveillance program. It is not intended to diagnose MRSA infection nor to guide or monitor treatment for MRSA infections. Performed at [facility], [address] [place], [place] [address]          Radiology Studies: CT ABDOMEN PELVIS W CONTRAST  Result Date: 02/18/2020 CLINICAL DATA:  Diverticulitis suspected. Nausea and vomiting. Rapid heart rate. EXAM: CT ABDOMEN AND PELVIS WITH CONTRAST TECHNIQUE: Multidetector CT imaging of the abdomen and pelvis was performed using the standard protocol following bolus administration of intravenous contrast. CONTRAST:  78mL OMNIPAQUE IOHEXOL 300 MG/ML  SOLN COMPARISON:  August 01, 2018 FINDINGS: Lower chest: There is a moderate to large right-sided pleural effusion again identified. There is a smaller left effusion, larger in the interval. Atelectasis is associated with the effusions. Cardiomegaly persists. No other abnormalities in the lower chest. Hepatobiliary: There is new gallbladder wall thickening. Cholelithiasis is identified. There is increased attenuation in the fat of the porta hepatis adjacent to the gallbladder. Hepatic steatosis. No hepatic masses. The portal vein is patent. Pancreas: Fatty deposition in the neck of the pancreas. No mass or evidence of pancreatitis. Spleen: Normal in size  without focal abnormality. Adrenals/Urinary Tract: Adrenal glands are normal. No renal stones, hydronephrosis, or perinephric stranding. No renal masses are noted. No ureterectasis or ureteral stones. The bladder is normal. Stomach/Bowel: The stomach and small bowel are normal. Scattered colonic diverticulosis is seen without diverticulitis. The appendix is not seen but there is no secondary evidence of appendicitis. Vascular/Lymphatic: Calcified atherosclerosis is seen in the nonaneurysmal aorta. No suspicious adenopathy. Reproductive: Status post hysterectomy. No adnexal masses. Other: Fluid in the pelvis is likely reactive to the gallbladder findings described above. Musculoskeletal: No acute or significant osseous findings. IMPRESSION: 1. New gallbladder wall thickening and adjacent fat stranding. Cholelithiasis. The constellation of findings is concerning for acute cholecystitis. Recommend right upper quadrant ultrasound. 2. There is a moderate to large right-sided pleural effusion again identified. The smaller left effusion is larger in the interval. 3. Scattered colonic diverticulosis without diverticulitis. 4. Fluid in the pelvis is likely reactive to the gallbladder findings. Electronically Signed   By: Gerome Sam III M.D   On: 02/18/2020 11:28   DG Chest Port 1 View  Result Date: 02/19/2020 CLINICAL DATA:  US guided thoracentesis Findings: Removed 1000 ml from right chest. EXAM: PORTABLE CHEST - 1 VIEW COMPARISON:  02/18/2020 FINDINGS: No pneumothorax. Persistent bilateral pleural effusions. Atelectasis/consolidation in the lung bases left greater than right. Perihilar interstitial opacities right greater than left as before. Heart size upper limits normal for technique. Aortic Atherosclerosis (ICD10-170.0). Visualized bones unremarkable. IMPRESSION: No pneumothorax post thoracentesis. Electronically Signed   By: Corlis Leak M.D.   On: 02/19/2020 09:39   DG Chest Portable 1 View  Result Date:  02/18/2020 CLINICAL DATA:  Chest pain.  Tachycardia. EXAM: PORTABLE CHEST 1 VIEW COMPARISON:  Jul 30, 2018. FINDINGS: Layering right greater than left pleural effusions. Hazy opacity the right lung, favored to reflect layering fluid. Additional more focal opacities at the left lung base. Mild diffuse interstitial prominence.  No visible pneumothorax on this limited portable supine radiograph. Biapical pleuroparenchymal scarring. Enlarged cardiac silhouette. Aortic atherosclerosis. No acute osseous abnormality IMPRESSION: 1. Layering right greater than left pleural effusions. Mild interstitial prominence, suggestive of mild interstitial edema. 2. Left basilar opacities may represent atelectasis, aspiration, and/or pneumonia. 3. Cardiomegaly. Electronically Signed   By: Feliberto Harts MD   On: 02/18/2020 10:15   US Abdomen Limited RUQ (LIVER/GB)  Result Date: 02/18/2020 CLINICAL DATA:  Right upper quadrant abdominal pain. EXAM: ULTRASOUND ABDOMEN LIMITED RIGHT UPPER QUADRANT COMPARISON:  CT from same day FINDINGS: Gallbladder: There is gallbladder wall thickening and pericholecystic free fluid. Multiple gallstones are noted. The sonographic Eulah Pont sign is negative. Common bile duct: Diameter: 3 mm Liver: No focal lesion identified. Within normal limits in parenchymal echogenicity. Portal vein is patent on color Doppler imaging with normal direction of blood flow towards the liver. Other: There is a right-sided pleural effusion. IMPRESSION: 1. Findings are equivocal for calculus cholecystitis. There are gallstones in the presence of gallbladder wall thickening and pericholecystic free fluid, however the sonographic Eulah Pont sign is reported as negative. Consider HIDA scan for further evaluation. 2. Large right-sided pleural effusion incidentally noted. Electronically Signed   By: Katherine Mantle M.D.   On: 02/18/2020 15:08   US THORACENTESIS ASP PLEURAL SPACE W/IMG GUIDE  Result Date:  02/19/2020 INDICATION: 86 year old with pleural effusions. EXAM: ULTRASOUND GUIDED RIGHT THORACENTESIS MEDICATIONS: None. COMPLICATIONS: None immediate. PROCEDURE: An ultrasound guided thoracentesis was thoroughly discussed with the patient and questions answered. The benefits, risks, alternatives and complications were also discussed. The patient understands and wishes to proceed with the procedure. Written consent was obtained. Ultrasound was performed to localize and mark an adequate pocket of fluid in the right chest. The area was then prepped and draped in the normal sterile fashion. 1% Lidocaine was used for local anesthesia. Under ultrasound guidance a 6 Fr Safe-T-Centesis catheter was introduced. Thoracentesis was performed. The catheter was removed and a dressing applied. FINDINGS: A total of approximately 1 L of yellow fluid was removed. Samples were sent to the laboratory as requested by the clinical team. Negative for pneumothorax on the follow-up chest radiograph. IMPRESSION: Successful ultrasound guided right thoracentesis yielding 1 L of pleural fluid. Electronically Signed   By: Richarda Overlie M.D.   On: 02/19/2020 12:02        Scheduled Meds: . amiodarone  400 mg Oral BID  . apixaban  5 mg Oral BID  . aspirin EC  81 mg Oral Daily  . calcium carbonate  400 mg of elemental calcium Oral BID  . Chlorhexidine Gluconate Cloth  6 each Topical Daily  . metoprolol tartrate  50 mg Oral BID  . metroNIDAZOLE  500 mg Oral Q8H  . multivitamin with minerals  1 tablet Oral Daily   Continuous Infusions: . sodium chloride 75 mL/hr at 02/20/20 0400  . ceFEPime (MAXIPIME) IV Stopped (02/19/20 2257)     LOS: 2 days    Time spent: 36 mins    Charise Killian, MD [facility] Pager [phone]  If 7PM-7AM, please contact night-coverage 02/20/2020, 8:23 AM

## 2020-02-20 NOTE — Progress Notes (Signed)
Patient transferred to room 260. Report called to [facility] on 2A. Only belongings at bedside were patient's socks which were sent with her to her new room. Patient aware of transfer and this RN left message on patient's daughter's phone about transfer.

## 2020-02-21 DIAGNOSIS — I4891 Unspecified atrial fibrillation: Secondary | ICD-10-CM

## 2020-02-21 DIAGNOSIS — E871 Hypo-osmolality and hyponatremia: Secondary | ICD-10-CM

## 2020-02-21 DIAGNOSIS — K8000 Calculus of gallbladder with acute cholecystitis without obstruction: Secondary | ICD-10-CM

## 2020-02-21 LAB — CBC
HCT: 42.7 % (ref 36.0–46.0)
Hemoglobin: 14 g/dL (ref 12.0–15.0)
MCH: 28.5 pg (ref 26.0–34.0)
MCHC: 32.8 g/dL (ref 30.0–36.0)
MCV: 87 fL (ref 80.0–100.0)
Platelets: 219 10*3/uL (ref 150–400)
RBC: 4.91 MIL/uL (ref 3.87–5.11)
RDW: 15 % (ref 11.5–15.5)
WBC: 10.7 10*3/uL — ABNORMAL HIGH (ref 4.0–10.5)
nRBC: 0 % (ref 0.0–0.2)

## 2020-02-21 LAB — COMPREHENSIVE METABOLIC PANEL
ALT: 67 U/L — ABNORMAL HIGH (ref 0–44)
AST: 24 U/L (ref 15–41)
Albumin: 3.3 g/dL — ABNORMAL LOW (ref 3.5–5.0)
Alkaline Phosphatase: 96 U/L (ref 38–126)
Anion gap: 8 (ref 5–15)
BUN: 20 mg/dL (ref 8–23)
CO2: 22 mmol/L (ref 22–32)
Calcium: 8.7 mg/dL — ABNORMAL LOW (ref 8.9–10.3)
Chloride: 99 mmol/L (ref 98–111)
Creatinine, Ser: 0.88 mg/dL (ref 0.44–1.00)
GFR, Estimated: >60 mL/min (ref >60)
Glucose, Bld: 181 mg/dL — ABNORMAL HIGH (ref 70–99)
Potassium: 4.7 mmol/L (ref 3.5–5.1)
Sodium: 129 mmol/L — ABNORMAL LOW (ref 135–145)
Total Bilirubin: 1 mg/dL (ref 0.3–1.2)
Total Protein: 6.7 g/dL (ref 6.5–8.1)

## 2020-02-21 LAB — GLUCOSE, CAPILLARY: Glucose-Capillary: 159 mg/dL — ABNORMAL HIGH (ref 70–99)

## 2020-02-21 MED ORDER — ACETAMINOPHEN 325 MG PO TABS
650.0000 mg | ORAL_TABLET | Freq: Four times a day (QID) | ORAL | Status: DC | PRN
  Administered 2020-02-21: 650 mg via ORAL
  Filled 2020-02-21: qty 2

## 2020-02-21 MED ORDER — SODIUM CHLORIDE 0.9 % IV SOLN: INTRAVENOUS | Status: DC

## 2020-02-21 MED ORDER — TRAZODONE HCL 50 MG PO TABS
50.0000 mg | ORAL_TABLET | Freq: Once | ORAL | Status: AC
  Administered 2020-02-21: 50 mg via ORAL
  Filled 2020-02-21: qty 1

## 2020-02-21 MED ORDER — METOPROLOL TARTRATE 50 MG PO TABS
100.0000 mg | ORAL_TABLET | Freq: Two times a day (BID) | ORAL | Status: DC
  Administered 2020-02-21 – 2020-02-22 (×4): 100 mg via ORAL
  Filled 2020-02-21 (×4): qty 2

## 2020-02-21 MED ORDER — LACTULOSE 10 GM/15ML PO SOLN
20.0000 g | Freq: Once | ORAL | Status: AC
  Administered 2020-02-21: 16:00:00 20 g via ORAL
  Filled 2020-02-21: qty 30

## 2020-02-21 NOTE — Progress Notes (Signed)
Patient Name: Lisa Moon Date of Encounter: 02/21/2020  Hospital Problem List     Active Problems:   Acute calculous cholecystitis   Severe sepsis with septic shock (HCC)   Atrial fibrillation with RVR (HCC)   Bradycardia   HLD (hyperlipidemia)   Hypertension   Pleural effusion   Hyperkalemia   AKI (acute kidney injury) (HCC)   Elevated troponin   Hyponatremia    Patient Profile     86 y.o.femalewith history ofhyperlipidemia, hypertension, hyperglycemia with a history of moderate aortic mitral and tricuspid insufficiency. She is a history of atrial fibrillation and she had an A. fib ablation at [facility] in 1993.She was recently earlier this week for palpitations and tachycardia. She also had severe diarrhea with nausea and poor p.o. intake. She presented to the emergency room however did not complete the visit due to prolonged wait. Her sodium level was 129 with a white count of 12.7. She had been treated with metoprolol tartrate 50 mg twice daily. Due to her rapid heart rate she had Cardizem 30 mg twice daily for rate control continue with metoprolol. She had an appointment set up with cardiology. She presented to the emergency room's morning with plaints of rapid heart rate. She was given Cardizem in route and her heart rate dropped from the 150s to the 50s. She developed nausea and was noted to have a heart rate in the 30s. She was given low-dose epi drip with improved heart rate in the 60s. She is currently hemodynamically stable. Her BNP is 628. She had a serum troponin drawn which was 22 similar to a level of 32 2 days ago. No chest pain. Electrolytes revealed acute on chronic renal insufficiency with a creatinine of 1.05. BUN 25. Potassium 5.2. Sodium 126.Patient and her daughter states she has had diarrhea nausea and poor p.o. intake for several days. She has not had recurrent atrial fibrillation since her ablation in the 90s. She was on chronic metoprolol  tartrate 50 bid at home.  Subjective     Inpatient Medications     amiodarone  400 mg Oral BID   apixaban  5 mg Oral BID   aspirin EC  81 mg Oral Daily   calcium carbonate  400 mg of elemental calcium Oral BID   Chlorhexidine Gluconate Cloth  6 each Topical Daily   metoprolol tartrate  50 mg Oral BID   metroNIDAZOLE  500 mg Oral Q8H   multivitamin with minerals  1 tablet Oral Daily    Vital Signs    Vitals:   02/20/20 1700 02/20/20 1949 02/21/20 0314 02/21/20 0726  BP: (!) 106/49 116/74 113/78 123/86  Pulse: (!) 54 (!) 106 (!) 102 75  Resp:  17 18 18   Temp: 98.3 F (36.8 C) 97.8 F (36.6 C) (!) 97.4 F (36.3 C) 97.8 F (36.6 C)  TempSrc: Oral Oral Oral Oral  SpO2: 92% 94% 90% 92%  Weight:   90.1 kg   Height:        Intake/Output Summary (Last 24 hours) at 02/21/2020 02/23/2020 Last data filed at 02/21/2020 0726 Gross per 24 hour  Intake 805.31 ml  Output 1125 ml  Net -319.69 ml   Filed Weights   02/20/20 0459 02/20/20 1405 02/21/20 0314  Weight: 82.2 kg 86.5 kg 90.1 kg    Physical Exam      Labs    CBC Recent Labs    02/20/20 0511 02/21/20 0630  WBC 10.8* 10.7*  HGB 13.4 14.0  HCT 41.3  42.7  MCV 87.7 87.0  PLT 215 219   Basic Metabolic Panel Recent Labs    93/23/5512/19/21 0921 02/18/20 1507 02/20/20 0511 02/21/20 0630  NA 126*   < > 134* 129*  K 5.2*   < > 4.5 4.7  CL 94*   < > 100 99  CO2 18*   < > 24 22  GLUCOSE 282*   < > 147* 181*  BUN 25*   < > 20 20  CREATININE 1.05*   < > 0.82 0.88  CALCIUM 8.9   < > 8.6* 8.7*  MG 2.1  --   --   --    < > = values in this interval not displayed.   Liver Function Tests Recent Labs    02/20/20 0511 02/21/20 0630  AST 37 24  ALT 86* 67*  ALKPHOS 95 96  BILITOT 0.8 1.0  PROT 6.7 6.7  ALBUMIN 3.3* 3.3*   No results for input(s): LIPASE, AMYLASE in the last 72 hours. Cardiac Enzymes No results for input(s): CKTOTAL, CKMB, CKMBINDEX, TROPONINI in the last 72 hours. BNP Recent Labs     02/18/20 0921  BNP 628.7*   D-Dimer No results for input(s): DDIMER in the last 72 hours. Hemoglobin A1C Recent Labs    02/19/20 0645  HGBA1C 7.4*   Fasting Lipid Panel Recent Labs    02/19/20 0645  CHOL 125  HDL 42  LDLCALC 63  TRIG 98  CHOLHDL 3.0   Thyroid Function Tests Recent Labs    02/18/20 0921  TSH 2.258    Telemetry    afib with rvr  ECG    afib with rvr  Radiology    CT ABDOMEN PELVIS W CONTRAST  Result Date: 02/18/2020 CLINICAL DATA:  Diverticulitis suspected. Nausea and vomiting. Rapid heart rate. EXAM: CT ABDOMEN AND PELVIS WITH CONTRAST TECHNIQUE: Multidetector CT imaging of the abdomen and pelvis was performed using the standard protocol following bolus administration of intravenous contrast. CONTRAST:  80mL OMNIPAQUE IOHEXOL 300 MG/ML  SOLN COMPARISON:  August 01, 2018 FINDINGS: Lower chest: There is a moderate to large right-sided pleural effusion again identified. There is a smaller left effusion, larger in the interval. Atelectasis is associated with the effusions. Cardiomegaly persists. No other abnormalities in the lower chest. Hepatobiliary: There is new gallbladder wall thickening. Cholelithiasis is identified. There is increased attenuation in the fat of the porta hepatis adjacent to the gallbladder. Hepatic steatosis. No hepatic masses. The portal vein is patent. Pancreas: Fatty deposition in the neck of the pancreas. No mass or evidence of pancreatitis. Spleen: Normal in size without focal abnormality. Adrenals/Urinary Tract: Adrenal glands are normal. No renal stones, hydronephrosis, or perinephric stranding. No renal masses are noted. No ureterectasis or ureteral stones. The bladder is normal. Stomach/Bowel: The stomach and small bowel are normal. Scattered colonic diverticulosis is seen without diverticulitis. The appendix is not seen but there is no secondary evidence of appendicitis. Vascular/Lymphatic: Calcified atherosclerosis is seen in the  nonaneurysmal aorta. No suspicious adenopathy. Reproductive: Status post hysterectomy. No adnexal masses. Other: Fluid in the pelvis is likely reactive to the gallbladder findings described above. Musculoskeletal: No acute or significant osseous findings. IMPRESSION: 1. New gallbladder wall thickening and adjacent fat stranding. Cholelithiasis. The constellation of findings is concerning for acute cholecystitis. Recommend right upper quadrant ultrasound. 2. There is a moderate to large right-sided pleural effusion again identified. The smaller left effusion is larger in the interval. 3. Scattered colonic diverticulosis without diverticulitis. 4. Fluid in the  pelvis is likely reactive to the gallbladder findings. Electronically Signed   By: Gerome Sam III M.D   On: 02/18/2020 11:28   DG Chest Port 1 View  Result Date: 02/19/2020 CLINICAL DATA:  US guided thoracentesis Findings: Removed 1000 ml from right chest. EXAM: PORTABLE CHEST - 1 VIEW COMPARISON:  02/18/2020 FINDINGS: No pneumothorax. Persistent bilateral pleural effusions. Atelectasis/consolidation in the lung bases left greater than right. Perihilar interstitial opacities right greater than left as before. Heart size upper limits normal for technique. Aortic Atherosclerosis (ICD10-170.0). Visualized bones unremarkable. IMPRESSION: No pneumothorax post thoracentesis. Electronically Signed   By: Corlis Leak M.D.   On: 02/19/2020 09:39   DG Chest Portable 1 View  Result Date: 02/18/2020 CLINICAL DATA:  Chest pain.  Tachycardia. EXAM: PORTABLE CHEST 1 VIEW COMPARISON:  Jul 30, 2018. FINDINGS: Layering right greater than left pleural effusions. Hazy opacity the right lung, favored to reflect layering fluid. Additional more focal opacities at the left lung base. Mild diffuse interstitial prominence. No visible pneumothorax on this limited portable supine radiograph. Biapical pleuroparenchymal scarring. Enlarged cardiac silhouette. Aortic  atherosclerosis. No acute osseous abnormality IMPRESSION: 1. Layering right greater than left pleural effusions. Mild interstitial prominence, suggestive of mild interstitial edema. 2. Left basilar opacities may represent atelectasis, aspiration, and/or pneumonia. 3. Cardiomegaly. Electronically Signed   By: Feliberto Harts MD   On: 02/18/2020 10:15   US Abdomen Limited RUQ (LIVER/GB)  Result Date: 02/18/2020 CLINICAL DATA:  Right upper quadrant abdominal pain. EXAM: ULTRASOUND ABDOMEN LIMITED RIGHT UPPER QUADRANT COMPARISON:  CT from same day FINDINGS: Gallbladder: There is gallbladder wall thickening and pericholecystic free fluid. Multiple gallstones are noted. The sonographic Eulah Pont sign is negative. Common bile duct: Diameter: 3 mm Liver: No focal lesion identified. Within normal limits in parenchymal echogenicity. Portal vein is patent on color Doppler imaging with normal direction of blood flow towards the liver. Other: There is a right-sided pleural effusion. IMPRESSION: 1. Findings are equivocal for calculus cholecystitis. There are gallstones in the presence of gallbladder wall thickening and pericholecystic free fluid, however the sonographic Eulah Pont sign is reported as negative. Consider HIDA scan for further evaluation. 2. Large right-sided pleural effusion incidentally noted. Electronically Signed   By: Katherine Mantle M.D.   On: 02/18/2020 15:08   US THORACENTESIS ASP PLEURAL SPACE W/IMG GUIDE  Result Date: 02/19/2020 INDICATION: 86 year old with pleural effusions. EXAM: ULTRASOUND GUIDED RIGHT THORACENTESIS MEDICATIONS: None. COMPLICATIONS: None immediate. PROCEDURE: An ultrasound guided thoracentesis was thoroughly discussed with the patient and questions answered. The benefits, risks, alternatives and complications were also discussed. The patient understands and wishes to proceed with the procedure. Written consent was obtained. Ultrasound was performed to localize and mark an  adequate pocket of fluid in the right chest. The area was then prepped and draped in the normal sterile fashion. 1% Lidocaine was used for local anesthesia. Under ultrasound guidance a 6 Fr Safe-T-Centesis catheter was introduced. Thoracentesis was performed. The catheter was removed and a dressing applied. FINDINGS: A total of approximately 1 L of yellow fluid was removed. Samples were sent to the laboratory as requested by the clinical team. Negative for pneumothorax on the follow-up chest radiograph. IMPRESSION: Successful ultrasound guided right thoracentesis yielding 1 L of pleural fluid. Electronically Signed   By: Richarda Overlie M.D.   On: 02/19/2020 12:02    Assessment & Plan      86 y.o.femalewith history ofhyperlipidemia, hypertension, hyperglycemia with a history of moderate aortic mitral and tricuspid insufficiency. She is a history  of atrial fibrillation and she had an A. fib ablation at [facility] in 1993.She was recently earlier this week for palpitations and tachycardia. She also had severe diarrhea with nausea and poor p.o. intake. She presented to the emergency room however did not complete the visit due to prolonged wait. Her sodium level was 129 with a white count of 12.7. She had been treated with metoprolol tartrate 50 mg twice daily. Due to her rapid heart rate she had Cardizem 30 mg twice daily for rate control continue with metoprolol. She had an appointment set up with cardiology. She presented to the emergency room's morning with plaints of rapid heart rate.  Her BNP is 628. She had a serum troponin drawn which was 22 similar to a level of 32 2 days ago. No chest pain. Electrolytes revealed acute on chronic renal insufficiency with a creatinine of 1.05. BUN 25. Potassium 5.2. Sodium 126.  Atrial fibrillation-has had recurrent atrial fibrillation now since she has had diarrhea nausea and poor p.o. intake. Was ablated [facility] [age][birth year][age] at [facility], [place]. She presented to  emergency room with diarrhea nausea and poor p.o. intake and presented to the emergency room where she was in A. fib with variable but somewhat rapid ventricular response.Patient was started on IV amiodarone after her heart rate increased back up after Cardizem dose. Received a 75 cc bolus and was placed on a maintenance drip.  Will try to increase  metoprolol to 100 bid and continue oral load  amiodarone 400 mg bid. Follow for bradycardia and drop in bp  Diarrhea/nausea-etiology unclear.  Improved.   Valvular heart disease-has a history of moderate MR TR and AI. Echo donein June 2020 revealed normal LV function. This was not appreciably changed from 1 year earlier. She had a negative functional study in two thousand sixteen.  Abnormal troponin-minimally elevated secondary to demand. Not in acute coronary event. We'll follow with you.  Dr. Gwen Pounds will be following our patients through the weekend.   Signed, Darlin Priestly Lucianna Ostlund MD 02/21/2020, 8:22 AM  Pager: [phone]

## 2020-02-21 NOTE — Progress Notes (Signed)
PROGRESS NOTE    Lisa Moon  CNO:[id number] DOB: [birth year] DOA: 02/18/2020 PCP: Lynnea Ferrier, MD   Chief complaint.  Abdominal pain. Brief Narrative:  Lisa Moon is a 86 y.o. female with medical history significant of hypertension, hyperlipidemia, atrial fibrillation, who presents with abdominal pain, palpitation.  Upon arriving the emergency room, patient had atrial fibrillation with rapid immature response, received a dose of Cardizem, he developed hypotension and bradycardia.  Improved after giving IV fluids. Her CT scan showed cholelithiasis with possible acute cholecystitis, right upper quadrant showed possible cholecystitis.  Patient treated with antibiotics with cefepime and Flagyl. Also eval by general surgery, she is not a candidate for surgery.   Assessment & Plan:   Active Problems:   Acute calculous cholecystitis   Severe sepsis with septic shock (HCC)   Atrial fibrillation with RVR (HCC)   Bradycardia   HLD (hyperlipidemia)   Hypertension   Pleural effusion   Hyperkalemia   AKI (acute kidney injury) (HCC)   Elevated troponin   Hyponatremia  #1.  Severe sepsis with septic shock secondary to acute calculus cholecystitis. Acute calculus cholecystitis. Not a surgical candidate for general surgery. Condition improving today, abdominal pain is improving.  Continue antibiotics with cefepime and Flagyl for now. Patient still has significant nausea vomiting, she also had constipation, last bowel movement was Saturday.  She will give a dose of lactulose. Due to poor p.o. intake, I will give gentle rehydration.  2.  Hyponatremia. Due to poor p.o. intake.  Sodium dropped down again after initial improvement with normal saline.  We will give gentle rehydration.  3.  Atrial fibrillation paroxysmal with RVR and also bradycardia after treatment. Minimal elevation troponin Followed by cardiology.  4.  Essential hypertension. Continue some home medicines.  5.   Pleural effusion. Status post thoracentesis removing 1 L of fluid from the right.  Study showed transudate.      DVT prophylaxis: Eliquis Code Status: Full Family Communication: None Disposition Plan:  .   Status is: Inpatient  Remains inpatient appropriate because:Inpatient level of care appropriate due to severity of illness   Dispo: The patient is from: Home              Anticipated d/c is to: ?              Anticipated d/c date is: 2 days              Patient currently is not medically stable to d/c.        I/O last 3 completed shifts: In: 1684 [I.V.:1386.6; IV Piggyback:297.5] Out: 1910 [Urine:1910] Total I/O In: 240 [P.O.:240] Out: 300 [Urine:300]     Consultants:   General surgery, cardiology  Procedures: None  Antimicrobials:  Cefepime and Flagyl.  Subjective: Patient still complains significant nausea vomiting, poor appetite.  Her last bowel movement was Saturday.  Abdominal pain has resolved. Denies any short of breath or cough. No fever or chills. No dysuria or hematuria. No chest pain or palpitation  Objective: Vitals:   02/21/20 0314 02/21/20 0726 02/21/20 1127 02/21/20 1524  BP: 113/78 123/86 102/79 109/87  Pulse: (!) 102 75 99 (!) 101  Resp: 18 18 19 18   Temp: (!) 97.4 F (36.3 C) 97.8 F (36.6 C) (!) 97.5 F (36.4 C) 97.7 F (36.5 C)  TempSrc: Oral Oral Oral   SpO2: 90% 92% 92% 92%  Weight: 90.1 kg     Height:        Intake/Output Summary (Last  24 hours) at 02/21/2020 1534 Last data filed at 02/21/2020 1403 Gross per 24 hour  Intake 337.45 ml  Output 1250 ml  Net -912.55 ml   Filed Weights   02/20/20 0459 02/20/20 1405 02/21/20 0314  Weight: 82.2 kg 86.5 kg 90.1 kg    Examination:  General exam: Appears calm and comfortable  Respiratory system: Clear to auscultation. Respiratory effort normal. Cardiovascular system: Irregular. No JVD, murmurs, rubs, gallops or clicks. No pedal edema. Gastrointestinal system:  Abdomen is nondistended, soft and nontender. No organomegaly or masses felt. Normal bowel sounds heard. Central nervous system: Alert and oriented. No focal neurological deficits. Extremities: Symmetric  Skin: No rashes, lesions or ulcers Psychiatry: Mood & affect appropriate.     Data Reviewed: I have personally reviewed following labs and imaging studies  CBC: Recent Labs  Lab 02/15/20 1950 02/18/20 0921 02/19/20 0645 02/20/20 0511 02/21/20 0630  WBC 12.7* 12.7* 11.9* 10.8* 10.7*  HGB 14.8 15.1* 13.4 13.4 14.0  HCT 43.8 46.0 39.9 41.3 42.7  MCV 84.6 85.7 86.6 87.7 87.0  PLT 240 248 196 215 219   Basic Metabolic Panel: Recent Labs  Lab 02/18/20 0921 02/18/20 1507 02/19/20 0645 02/20/20 0511 02/21/20 0630  NA 126* 129* 131* 134* 129*  K 5.2* 5.3* 4.3 4.5 4.7  CL 94* 94* 99 100 99  CO2 18* 25 23 24 22   GLUCOSE 282* 198* 160* 147* 181*  BUN 25* 23 21 20 20   CREATININE 1.05* 1.00 0.92 0.82 0.88  CALCIUM 8.9 8.9 7.8* 8.6* 8.7*  MG 2.1  --   --   --   --    GFR: Estimated Creatinine Clearance: 52.8 mL/min (by C-G formula based on SCr of 0.88 mg/dL). Liver Function Tests: Recent Labs  Lab 02/15/20 1950 02/18/20 0921 02/20/20 0511 02/21/20 0630  AST 40 114* 37 24  ALT 35 143* 86* 67*  ALKPHOS 88 124 95 96  BILITOT 1.1 1.1 0.8 1.0  PROT 7.8 7.6 6.7 6.7  ALBUMIN 3.6 3.6 3.3* 3.3*   Recent Labs  Lab 02/15/20 1950  LIPASE 20   No results for input(s): AMMONIA in the last 168 hours. Coagulation Profile: Recent Labs  Lab 02/18/20 0921  INR 1.1   Cardiac Enzymes: No results for input(s): CKTOTAL, CKMB, CKMBINDEX, TROPONINI in the last 168 hours. BNP (last 3 results) No results for input(s): PROBNP in the last 8760 hours. HbA1C: Recent Labs    02/19/20 0645  HGBA1C 7.4*   CBG: Recent Labs  Lab 02/19/20 0835 02/19/20 1101 02/20/20 0739 02/21/20 0723  GLUCAP 162* 143* 151* 159*   Lipid Profile: Recent Labs    02/19/20 0645  CHOL 125  HDL 42   LDLCALC 63  TRIG 98  CHOLHDL 3.0   Thyroid Function Tests: No results for input(s): TSH, T4TOTAL, FREET4, T3FREE, THYROIDAB in the last 72 hours. Anemia Panel: No results for input(s): VITAMINB12, FOLATE, FERRITIN, TIBC, IRON, RETICCTPCT in the last 72 hours. Sepsis Labs: Recent Labs  Lab 02/18/20 1101 02/18/20 1235 02/18/20 1507 02/19/20 1521 02/19/20 1821  PROCALCITON  --   --  <0.10  --   --   LATICACIDVEN 4.5* 3.7*  --  2.2* 1.6    Recent Results (from the past 240 hour(s))  Resp Panel by RT-PCR (Flu A&B, Covid) Nasopharyngeal Swab     Status: None   Collection Time: 02/18/20  9:21 AM   Specimen: Nasopharyngeal Swab; Nasopharyngeal(NP) swabs in vial transport medium  Result Value Ref Range Status   SARS  Coronavirus 2 by RT PCR NEGATIVE NEGATIVE Final    Comment: (NOTE) SARS-CoV-2 target nucleic acids are NOT DETECTED.  The SARS-CoV-2 RNA is generally detectable in upper respiratory specimens during the acute phase of infection. The lowest concentration of SARS-CoV-2 viral copies this assay can detect is 138 copies/mL. A negative result does not preclude SARS-Cov-2 infection and should not be used as the sole basis for treatment or other patient management decisions. A negative result may occur with  improper specimen collection/handling, submission of specimen other than nasopharyngeal swab, presence of viral mutation(s) within the areas targeted by this assay, and inadequate number of viral copies(<138 copies/mL). A negative result must be combined with clinical observations, patient history, and epidemiological information. The expected result is Negative.  Fact Sheet for Patients:  BloggerCourse.comhttps://www.fda.gov/media/152166/download  Fact Sheet for Healthcare Providers:  SeriousBroker.ithttps://www.fda.gov/media/152162/download  This test is no t yet approved or cleared by the [place] FDA and  has been authorized for detection and/or diagnosis of SARS-CoV-2 by FDA under an  Emergency Use Authorization (EUA). This EUA will remain  in effect (meaning this test can be used) for the duration of the COVID-19 declaration under Section 564(b)(1) of the Act, 21 U.S.C.section 360bbb-3(b)(1), unless the authorization is terminated  or revoked sooner.       Influenza A by PCR NEGATIVE NEGATIVE Final   Influenza B by PCR NEGATIVE NEGATIVE Final    Comment: (NOTE) The Xpert Xpress SARS-CoV-2/FLU/RSV plus assay is intended as an aid in the diagnosis of influenza from Nasopharyngeal swab specimens and should not be used as a sole basis for treatment. Nasal washings and aspirates are unacceptable for Xpert Xpress SARS-CoV-2/FLU/RSV testing.  Fact Sheet for Patients: BloggerCourse.comhttps://www.fda.gov/media/152166/download  Fact Sheet for Healthcare Providers: SeriousBroker.ithttps://www.fda.gov/media/152162/download  This test is not yet approved or cleared by the [place] FDA and has been authorized for detection and/or diagnosis of SARS-CoV-2 by FDA under an Emergency Use Authorization (EUA). This EUA will remain in effect (meaning this test can be used) for the duration of the COVID-19 declaration under Section 564(b)(1) of the Act, 21 U.S.C. section 360bbb-3(b)(1), unless the authorization is terminated or revoked.  Performed at [facility], [address] [place], [place] [address]   Blood culture (routine x 2)     Status: None (Preliminary result)   Collection Time: 02/18/20 11:01 AM   Specimen: BLOOD  Result Value Ref Range Status   Specimen Description BLOOD RIGHT ANTECUBITAL  Final   Special Requests AEROBIC BOTTLE ONLY Blood Culture adequate volume  Final   Culture   Final    NO GROWTH 3 DAYS Performed at [facility], [address] [place], [place] [address]    Report Status PENDING  Incomplete  Blood culture (routine x 2)     Status: None (Preliminary result)   Collection Time: 02/18/20 11:01 AM   Specimen: BLOOD  Result Value Ref Range Status    Specimen Description BLOOD LEFT ANTECUBITAL  Final   Special Requests   Final    BOTTLES DRAWN AEROBIC AND ANAEROBIC Blood Culture adequate volume   Culture   Final    NO GROWTH 3 DAYS Performed at [facility], [address] [place], [place] [address]    Report Status PENDING  Incomplete  Body fluid culture     Status: None (Preliminary result)   Collection Time: 02/19/20  9:12 AM   Specimen: PATH Cytology Pleural fluid  Result Value Ref Range Status   Specimen Description   Final  PLEURAL Performed at [facility], [address], [place], [place] [address]    Special Requests   Final    NONE Performed at [facility], [address] [place], [place] [address]    Gram Stain   Final    WBC PRESENT, PREDOMINANTLY MONONUCLEAR NO ORGANISMS SEEN CYTOSPIN SMEAR    Culture   Final    NO GROWTH 2 DAYS Performed at [facility], [address] [place], [place] [address]    Report Status PENDING  Incomplete  MRSA PCR Screening     Status: None   Collection Time: 02/19/20 11:18 AM   Specimen: Nasopharyngeal  Result Value Ref Range Status   MRSA by PCR NEGATIVE NEGATIVE Final    Comment:        The GeneXpert MRSA Assay (FDA approved for NASAL specimens only), is one component of a comprehensive MRSA colonization surveillance program. It is not intended to diagnose MRSA infection nor to guide or monitor treatment for MRSA infections. Performed at [facility], [address] [place], [place] [address]          Radiology Studies: No results found.      Scheduled Meds: . amiodarone  400 mg Oral BID  . apixaban  5 mg Oral BID  . aspirin EC  81 mg Oral Daily  . calcium carbonate  400 mg of elemental calcium Oral BID  . Chlorhexidine Gluconate Cloth  6 each Topical Daily  . lactulose  20 g Oral Once  . metoprolol tartrate  100 mg Oral BID  . metroNIDAZOLE  500 mg Oral Q8H  . multivitamin with minerals  1  tablet Oral Daily   Continuous Infusions: . sodium chloride    . ceFEPime (MAXIPIME) IV 2 g (02/21/20 0904)     LOS: 3 days    Time spent: 28 minutes    Marrion Coy, MD [facility]   To contact the attending provider between 7A-7P or the covering provider during after hours 7P-7A, please log into the web site [email or web address] and access using universal [place] password for that web site. If you do not have the password, please call the hospital operator.  02/21/2020, 3:34 PM

## 2020-02-22 DIAGNOSIS — I10 Essential (primary) hypertension: Secondary | ICD-10-CM

## 2020-02-22 LAB — BODY FLUID CULTURE: Culture: NO GROWTH

## 2020-02-22 LAB — BASIC METABOLIC PANEL
Anion gap: 9 (ref 5–15)
BUN: 26 mg/dL — ABNORMAL HIGH (ref 8–23)
CO2: 23 mmol/L (ref 22–32)
Calcium: 8.9 mg/dL (ref 8.9–10.3)
Chloride: 99 mmol/L (ref 98–111)
Creatinine, Ser: 0.94 mg/dL (ref 0.44–1.00)
GFR, Estimated: 59 mL/min — ABNORMAL LOW (ref >60)
Glucose, Bld: 203 mg/dL — ABNORMAL HIGH (ref 70–99)
Potassium: 4.9 mmol/L (ref 3.5–5.1)
Sodium: 131 mmol/L — ABNORMAL LOW (ref 135–145)

## 2020-02-22 LAB — CBC WITH DIFFERENTIAL/PLATELET
Abs Immature Granulocytes: 0.08 10*3/uL — ABNORMAL HIGH (ref 0.00–0.07)
Basophils Absolute: 0 10*3/uL (ref 0.0–0.1)
Basophils Relative: 0 %
Eosinophils Absolute: 0 10*3/uL (ref 0.0–0.5)
Eosinophils Relative: 0 %
HCT: 43.1 % (ref 36.0–46.0)
Hemoglobin: 14.2 g/dL (ref 12.0–15.0)
Immature Granulocytes: 1 %
Lymphocytes Relative: 10 %
Lymphs Abs: 1.2 10*3/uL (ref 0.7–4.0)
MCH: 28.9 pg (ref 26.0–34.0)
MCHC: 32.9 g/dL (ref 30.0–36.0)
MCV: 87.6 fL (ref 80.0–100.0)
Monocytes Absolute: 0.9 10*3/uL (ref 0.1–1.0)
Monocytes Relative: 7 %
Neutro Abs: 10.1 10*3/uL — ABNORMAL HIGH (ref 1.7–7.7)
Neutrophils Relative %: 82 %
Platelets: 241 10*3/uL (ref 150–400)
RBC: 4.92 MIL/uL (ref 3.87–5.11)
RDW: 15.2 % (ref 11.5–15.5)
WBC: 12.3 10*3/uL — ABNORMAL HIGH (ref 4.0–10.5)
nRBC: 0 % (ref 0.0–0.2)

## 2020-02-22 LAB — GLUCOSE, CAPILLARY: Glucose-Capillary: 169 mg/dL — ABNORMAL HIGH (ref 70–99)

## 2020-02-22 LAB — CHOLESTEROL, BODY FLUID: Cholesterol, Fluid: 12 mg/dL

## 2020-02-22 LAB — MAGNESIUM: Magnesium: 2.3 mg/dL (ref 1.7–2.4)

## 2020-02-22 MED ORDER — ONDANSETRON HCL 4 MG/2ML IJ SOLN
4.0000 mg | Freq: Once | INTRAMUSCULAR | Status: AC
  Administered 2020-02-22: 14:00:00 4 mg via INTRAVENOUS
  Filled 2020-02-22: qty 2

## 2020-02-22 MED ORDER — CIPROFLOXACIN HCL 500 MG PO TABS
500.0000 mg | ORAL_TABLET | Freq: Two times a day (BID) | ORAL | Status: DC
  Administered 2020-02-22 – 2020-02-24 (×4): 500 mg via ORAL
  Filled 2020-02-22 (×6): qty 1

## 2020-02-22 MED ORDER — ONDANSETRON HCL 4 MG/2ML IJ SOLN: 4.0000 mg | INTRAMUSCULAR | Status: DC | PRN

## 2020-02-22 MED ORDER — SODIUM CHLORIDE 0.9 % IV SOLN: INTRAVENOUS | Status: DC

## 2020-02-22 MED ORDER — PANTOPRAZOLE SODIUM 40 MG PO TBEC
40.0000 mg | DELAYED_RELEASE_TABLET | Freq: Every day | ORAL | Status: DC
  Administered 2020-02-23 – 2020-02-24 (×2): 40 mg via ORAL
  Filled 2020-02-22 (×2): qty 1

## 2020-02-22 NOTE — Care Management Important Message (Signed)
Important Message  Patient Details  Name: Lisa Moon MRN: [id number] Date of Birth: 03/26/1932   Medicare Important Message Given:  Yes     Olegario Messier A Christopher Hink 02/22/2020, 11:20 AM

## 2020-02-22 NOTE — TOC Progression Note (Signed)
Transition of Care [facility]) - Progression Note    Patient Details  Name: Lisa Moon MRN: [id number] Date of Birth: 12-Feb-1933  Transition of Care [facility]) CM/SW Contact  Hetty Ely, RN Phone Number: 02/22/2020, 2:56 PM  Clinical Narrative:  Spoke with patient and daughter in room, patient complaining of being nauseated and unable to eat. Daughter feels patient is dehydrated and need fluids, voices concern for discharge in am, will discuss with Nurse and Attending. Patient and daughter aware of [facility] PT to come for services to home when discharged.          Expected Discharge Plan and Services                                                 Social Determinants of Health (SDOH) Interventions    Readmission Risk Interventions No flowsheet data found.

## 2020-02-22 NOTE — Evaluation (Signed)
Physical Therapy Evaluation Patient Details Name: Lisa Moon MRN: [id number] DOB: [birth year] Today's Date: 02/22/2020   History of Present Illness  Pt is an 86yo F admitted to [facility] on 12/19 for dehydration and Afib with RVR. Pt administered Cardizem. Pt with hyupotension and SOB, with c/o dizziness, fatigue, weakness and lightheadedness overnight. Significant PMH includes: HTN, HLD, Afib, hx of moderate aortic mitral/tricuspid insufficiency. Imaging revealed acute calculus colecystitis and pleural effusion. Pt not surgical candidate.    Clinical Impression  Pt is a 86 year old F admitted to hospital on 12/19 for dehydration and Afib with RVR. At baseline, pt was Ind with all ADL's, IADL's (except vacuuming), and community ambulation without AD; pt notes using SPC <25% of the time when at "junk store". Daughter present in room during evaluation and very eager/involved in pt care. Pt presents with generalized weakness, decreased activity tolerance, decreased balance, Afib, and c/o lightheadedness/nausea with mobility, resulting in impaired functional mobility from baseline. Due to deficits, pt required min assist for bed mobility, min guard for transfers, and min guard for short distance ambulation with RW. Further mobility limited secondary to pt c/o lightheadeness, nausea, Afib, and generalized weakness with mobility. HR ranged from 93-120's bpm during mobility. Pt with labored breathing and RPE of 7-8/10 indicating "vigorous activity" post ambulation, indicating decreased activity tolerance. Deficits limit the pt's ability to safely and independently perform ADL's, transfer, and ambulate. Pt will benefit from acute skilled PT services to address deficits for return to baseline function. At this time, PT recommends DC home with 24/7 care and HHPT to address deficits and improve overall safety with functional mobility. Pt seems hesitant about HHPT, as daughter states her and her brother can help get the  pt stronger without therapy intervention. Pt and daughter educated regarding benefits of participation with PT in hospital and HHPT eval at home to set up POC. They verbalized understanding. Will also recommend 3in1 for energy conservation with toileting/toilet transfers at home.     Follow Up Recommendations Home health PT;Supervision/Assistance - 24 hour    Equipment Recommendations  3in1 (PT)    Recommendations for Other Services       Precautions / Restrictions Precautions Precautions: Fall Restrictions Weight Bearing Restrictions: No Other Position/Activity Restrictions: Watch HR      Mobility  Bed Mobility Overal bed mobility: Needs Assistance Bed Mobility: Supine to Sit     Supine to sit: Min assist;HOB elevated     General bed mobility comments: Min assist for trunk facilitation to sit EOB with HOB elevated. Multimodal cues for sequencing, hand placement, and positioning to facilitate transfer. Required increased time/effort.    Transfers Overall transfer level: Needs assistance Equipment used: Rolling walker (2 wheeled) Transfers: Sit to/from Stand Sit to Stand: Min guard;From elevated surface         General transfer comment: Min guard to stand from elevated bed height with RW. Verbal cues for hand placement and sequencing. Required increased time/effort. C/o lightheadedness and nausea upon standing that eased with time  Ambulation/Gait Ambulation/Gait assistance: Min guard;+2 safety/equipment Gait Distance (Feet): 15 Feet Assistive device: Rolling walker (2 wheeled)   Gait velocity: decreased   General Gait Details: Min guard for safety to ambulate around bed to recliner with RW. Demonstrated early reciprocal gait, decreased step length/foot clearance bil, slowed cadence, and downward gaze.      Balance Overall balance assessment: Needs assistance Sitting-balance support: No upper extremity supported;Feet unsupported Sitting balance-Leahy Scale:  Fair Sitting balance - Comments: Fair seated balance  at EOB, demonstrating increased posterior lean when donning brief. Postural control: Posterior lean Standing balance support: During functional activity Standing balance-Leahy Scale: Fair Standing balance comment: Fair standing balance with BUE support in RW.                             Pertinent Vitals/Pain Pain Assessment: No/denies pain    Home Living Family/patient expects to be discharged to:: Private residence Living Arrangements: Children (Son; daughter lives 80mi down the road) Available Help at Discharge: Family;Available 24 hours/day Type of Home: House Home Access: Stairs to enter Entrance Stairs-Rails: Can reach both Entrance Stairs-Number of Steps: 5 STE bil railing Home Layout: One level Home Equipment: Walker - 2 wheels;Walker - 4 wheels;Cane - single point;Shower seat - built in;Grab bars - toilet;Grab bars - tub/shower;Transport chair      Prior Function Level of Independence: Independent         Comments: Pt reports being Ind with all ADL's, IADL's (except vacuuming due to LBP), and community ambulation without AD. Pt uses SPC <25% of the time with community ambulation when in "junk stores". Pt able to drive, but children primarily provide transportation.     Hand Dominance   Dominant Hand: Right    Extremity/Trunk Assessment   Upper Extremity Assessment Upper Extremity Assessment: Defer to OT evaluation    Lower Extremity Assessment Lower Extremity Assessment: Generalized weakness;RLE deficits/detail;LLE deficits/detail RLE Deficits / Details: Demonstrates 3/5 strength with isolated strength testing, unable to maintain testing position against gravity for longer than 5sec. Observed to be at least 3+/5 as she was able to WB with mobility in RW. RLE Sensation: WNL RLE Coordination: WNL LLE Deficits / Details: Demonstrates 3/5 strength with isolated strength testing, unable to maintain  testing position against gravity for longer than 5sec. Observed to be at least 3+/5 as she was able to WB with mobility in RW. LLE Sensation: WNL LLE Coordination: WNL    Cervical / Trunk Assessment Cervical / Trunk Assessment: Normal  Communication   Communication: No difficulties  Cognition Arousal/Alertness: Awake/alert Behavior During Therapy: WFL for tasks assessed/performed Overall Cognitive Status: Within Functional Limits for tasks assessed                                 General Comments: Pt A&O x4      General Comments      Exercises Other Exercises Other Exercises: Pt able to participate in bed mobility, transfers, and short distance ambulation with RW. Pt grossly required min assist - min guard for mobility, due to c/o weakness, lightheadedness, and nausea. HR ranged from 109-121 bpm, limiting further mobility. Pt with labored breathing and RPE of 7-8/10 indicating "vigorous activity" post mobility. Other Exercises: Pt and daughter educated regarding: PT role, POC, DC recs, benefits of being OOB, increased mobility for toileting, and benefits of participation with therapy.   Assessment/Plan    PT Assessment Patient needs continued PT services  PT Problem List Decreased strength;Decreased mobility;Decreased activity tolerance;Cardiopulmonary status limiting activity;Decreased balance       PT Treatment Interventions Therapeutic exercise;Gait training;Balance training;Stair training;Neuromuscular re-education;Functional mobility training;Therapeutic activities    PT Goals (Current goals can be found in the Care Plan section)  Acute Rehab PT Goals Patient Stated Goal: to go home PT Goal Formulation: With patient Time For Goal Achievement: 03/07/20 Potential to Achieve Goals: Good    Frequency Min 2X/week  Barriers to discharge        Co-evaluation PT/OT/SLP Co-Evaluation/Treatment: Yes Reason for Co-Treatment: For patient/therapist safety;To  address functional/ADL transfers PT goals addressed during session: Mobility/safety with mobility;Balance;Proper use of DME;Strengthening/ROM OT goals addressed during session: ADL's and self-care;Proper use of Adaptive equipment and DME       AM-PAC PT "6 Clicks" Mobility  Outcome Measure Help needed turning from your back to your side while in a flat bed without using bedrails?: A Little Help needed moving from lying on your back to sitting on the side of a flat bed without using bedrails?: A Little Help needed moving to and from a bed to a chair (including a wheelchair)?: A Little Help needed standing up from a chair using your arms (e.g., wheelchair or bedside chair)?: A Little Help needed to walk in hospital room?: A Little Help needed climbing 3-5 steps with a railing? : A Lot 6 Click Score: 17    End of Session Equipment Utilized During Treatment: Gait belt Activity Tolerance: Patient tolerated treatment well;Patient limited by fatigue Patient left: in chair;with call bell/phone within reach;with chair alarm set;with family/visitor present Nurse Communication: Mobility status PT Visit Diagnosis: Unsteadiness on feet (R26.81);Muscle weakness (generalized) (M62.81)    Time: 2683-4196 PT Time Calculation (min) (ACUTE ONLY): 23 min   Charges:   PT Evaluation $PT Eval Moderate Complexity: 1 Mod PT Treatments $Therapeutic Activity: 8-22 mins      Vira Blanco, PT, DPT 12:18 PM,02/22/20

## 2020-02-22 NOTE — Progress Notes (Signed)
Mobility Specialist - Progress Note   02/22/20 1500  Mobility  Activity Turned to right side;Turned to back (supine)  Level of Assistance Standby assist, set-up cues, supervision of patient - no hands on  Assistive Device None (bed rails)  Distance Ambulated (ft) 0 ft  Mobility Response Tolerated well  Mobility performed by Mobility specialist  $Mobility charge 1 Mobility    Pt was lying in bed upon arrival with daughter present in room. Pt utilizing room air. Pt c/o nausea and fatigue. Per discussion with daughter, pt had been vomiting most of day. Pt voiced that she had just gotten back to bed from [facility] and needed assistance with peri-care. Pt was able to roll to R side utilizing rails for support as mobility tech provided hygiene assist. Pt then rolled to back with no physical assistance. Pt repositioned to [facility] modA +2, with daughter providing assist. Further mobility deferred d/t nausea and fatigue, pt wanting to rest. Overall, pt tolerated session well. Pt was left in bed with all needs in reach. Daughter still present at exit.    [name] Mobility Specialist 02/22/20, 3:53 PM

## 2020-02-22 NOTE — Plan of Care (Signed)
Tap water enema given, pt had two BM. VSS. Will continue to monitor.  Problem: Education: Goal: Knowledge of General Education information will improve Description: Including pain rating scale, medication(s)/side effects and non-pharmacologic comfort measures Outcome: Progressing   Problem: Health Behavior/Discharge Planning: Goal: Ability to manage health-related needs will improve Outcome: Progressing   Problem: Clinical Measurements: Goal: Ability to maintain clinical measurements within normal limits will improve Outcome: Progressing Goal: Will remain free from infection Outcome: Progressing Goal: Diagnostic test results will improve Outcome: Progressing Goal: Respiratory complications will improve Outcome: Progressing Goal: Cardiovascular complication will be avoided Outcome: Progressing   Problem: Activity: Goal: Risk for activity intolerance will decrease Outcome: Progressing   Problem: Nutrition: Goal: Adequate nutrition will be maintained Outcome: Progressing   Problem: Coping: Goal: Level of anxiety will decrease Outcome: Progressing   Problem: Elimination: Goal: Will not experience complications related to bowel motility Outcome: Progressing Goal: Will not experience complications related to urinary retention Outcome: Progressing   Problem: Pain Managment: Goal: General experience of comfort will improve Outcome: Progressing   Problem: Safety: Goal: Ability to remain free from injury will improve Outcome: Progressing   Problem: Skin Integrity: Goal: Risk for impaired skin integrity will decrease Outcome: Progressing   Problem: Education: Goal: Knowledge of disease or condition will improve Outcome: Progressing Goal: Understanding of medication regimen will improve Outcome: Progressing   Problem: Activity: Goal: Ability to tolerate increased activity will improve Outcome: Progressing   Problem: Cardiac: Goal: Ability to achieve and maintain  adequate cardiopulmonary perfusion will improve Outcome: Progressing   Problem: Health Behavior/Discharge Planning: Goal: Ability to safely manage health-related needs after discharge will improve Outcome: Progressing

## 2020-02-22 NOTE — Evaluation (Signed)
Occupational Therapy Evaluation Patient Details Name: Lisa Moon MRN: [id number] DOB: [birth year] Today's Date: 02/22/2020    History of Present Illness Pt is an 86yo F admitted to [facility] on 12/19 for dehydration and Afib with RVR. Pt administered Cardizem. Pt with hyupotension and SOB, with c/o dizziness, fatigue, weakness and lightheadedness overnight. Significant PMH includes: HTN, HLD, Afib, hx of moderate aortic mitral/tricuspid insufficiency. Imaging revealed acute calculus colecystitis and pleural effusion. Pt not surgical candidate.   Clinical Impression   Lisa Moon was seen for OT evaluation this date. Prior to hospital admission, pt was Independent for mobility and ADLs. Pt lives c son; dtr available to provide 24/7. Pt presents to acute OT demonstrating impaired ADL performance and functional mobility 2/2 decreased LB access, functional strength/balance deficits, and decreased safety awareness. Pt currently requires MOD A don mesh panties seated EOB - assist for threading RLE and pulling over rear in standing. Pt would benefit from skilled OT to address noted impairments and functional limitations (see below for any additional details) in order to maximize safety and independence while minimizing falls risk and caregiver burden. Upon hospital discharge, recommend HHOT to maximize pt safety and return to functional independence during meaningful occupations of daily life.     Follow Up Recommendations  Home health OT;Supervision/Assistance - 24 hour    Equipment Recommendations  3 in 1 bedside commode    Recommendations for Other Services       Precautions / Restrictions Precautions Precautions: Fall Restrictions Weight Bearing Restrictions: No Other Position/Activity Restrictions: Watch HR      Mobility Bed Mobility Overal bed mobility: Needs Assistance Bed Mobility: Supine to Sit     Supine to sit: Min assist;HOB elevated     General bed mobility comments: Min assist  for trunk facilitation to sit EOB with HOB elevated. Multimodal cues for sequencing, hand placement, and positioning to facilitate transfer. Required increased time/effort.    Transfers Overall transfer level: Needs assistance Equipment used: Rolling walker (2 wheeled) Transfers: Sit to/from Stand Sit to Stand: Min guard;From elevated surface         General transfer comment: Vcs for technique    Balance Overall balance assessment: Needs assistance Sitting-balance support: No upper extremity supported;Feet unsupported Sitting balance-Leahy Scale: Fair Sitting balance - Comments: Fair seated balance at EOB, demonstrating increased posterior lean when donning brief. Postural control: Posterior lean Standing balance support: During functional activity Standing balance-Leahy Scale: Fair Standing balance comment: Fair standing balance with BUE support in RW.                           ADL either performed or assessed with clinical judgement   ADL Overall ADL's : Needs assistance/impaired                                       General ADL Comments: MOD A don mesh panties seated EOB - assist for threading RLE and pulling over rear in standing                  Pertinent Vitals/Pain Pain Assessment: No/denies pain     Hand Dominance Right   Extremity/Trunk Assessment Upper Extremity Assessment Upper Extremity Assessment: Generalized weakness   Lower Extremity Assessment Lower Extremity Assessment: Generalized weakness  LLE Sensation: WNL LLE Coordination: WNL   Cervical / Trunk Assessment Cervical / Trunk Assessment: Normal  Communication Communication Communication: No difficulties   Cognition Arousal/Alertness: Awake/alert Behavior During Therapy: WFL for tasks assessed/performed Overall Cognitive Status: Within Functional Limits for tasks assessed                                 General Comments: Pt inititally anxious  about mobility, improves c VCs   General Comments       Exercises Exercises: Other exercises Other Exercises Other Exercises: Pt and dtr educated re: OT role, DME recs, d/c recs, falls prevention, ECS, importanc eof mobility for functional strengthening Other Exercises: LBD, sup>sit, sit<>stand, sitting/standing balance/tolerance   Shoulder Instructions      Home Living Family/patient expects to be discharged to:: Private residence Living Arrangements: Children (son; dtr lives nearby) Available Help at Discharge: Family;Available 24 hours/day Type of Home: House Home Access: Stairs to enter [employer] of Steps: 5 STE bil railing Entrance Stairs-Rails: Can reach both Home Layout: One level     Bathroom Shower/Tub: Producer, television/film/video: Standard Bathroom Accessibility: Yes   Home Equipment: Environmental consultant - 2 wheels;Walker - 4 wheels;Cane - single point;Shower seat - built in;Grab bars - toilet;Grab bars - tub/shower;Transport chair          Prior Functioning/Environment Level of Independence: Independent        Comments: Pt reports being Ind with all ADL's, IADL's (except vacuuming due to LBP), and community ambulation without AD. Pt uses SPC <25% of the time with community ambulation when in "junk stores". Pt able to drive, but children primarily provide transportation.        OT Problem List: Decreased strength;Decreased range of motion;Decreased activity tolerance;Impaired balance (sitting and/or standing);Decreased safety awareness;Cardiopulmonary status limiting activity      OT Treatment/Interventions: Self-care/ADL training;Therapeutic exercise;Energy conservation;DME and/or AE instruction;Therapeutic activities;Patient/family education;Balance training    OT Goals(Current goals can be found in the care plan section) Acute Rehab OT Goals Patient Stated Goal: to go home OT Goal Formulation: With patient/family Time For Goal Achievement:  03/07/20 Potential to Achieve Goals: Good ADL Goals Pt Will Perform Grooming: with modified independence;standing (c LRAD PRN) Pt Will Perform Lower Body Dressing: with min guard assist;sit to/from stand (c LRAD PRN) Pt Will Transfer to Toilet: with modified independence;ambulating;regular height toilet (c LRAD PRN)  OT Frequency: Min 1X/week           Co-evaluation PT/OT/SLP Co-Evaluation/Treatment: Yes Reason for Co-Treatment: Necessary to address cognition/behavior during functional activity;For patient/therapist safety PT goals addressed during session: Mobility/safety with mobility;Proper use of DME OT goals addressed during session: ADL's and self-care      AM-PAC OT "6 Clicks" Daily Activity     Outcome Measure Help from another person eating meals?: None Help from another person taking care of personal grooming?: A Little Help from another person toileting, which includes using toliet, bedpan, or urinal?: A Little Help from another person bathing (including washing, rinsing, drying)?: A Lot Help from another person to put on and taking off regular upper body clothing?: None Help from another person to put on and taking off regular lower body clothing?: A Lot 6 Click Score: 18   End of Session Equipment Utilized During Treatment: Gait belt;Rolling walker Nurse Communication: Mobility status  Activity Tolerance: Patient tolerated treatment well Patient left: in chair;with call bell/phone within reach;with chair alarm set;with family/visitor present  OT Visit Diagnosis: Other abnormalities of gait and mobility (R26.89)  Time: 7209-4709 OT Time Calculation (min): 25 min Charges:  OT General Charges $OT Visit: 1 Visit OT Evaluation $OT Eval Moderate Complexity: 1 Mod  Kathie Dike, M.S. OTR/L  02/22/20, 1:16 PM  ascom [phone]

## 2020-02-22 NOTE — Progress Notes (Signed)
[facility] Encounter Note  Patient: Lisa Moon / Admit Date: 02/18/2020 / Date of Encounter: 02/22/2020, 10:57 AM   Subjective: 12/23.  Patient overall feeling much better than she had on admission.  No current evidence of anginal symptoms or congestive heart failure.  The patient has had full resolution of abdominal discomfort and is on appropriate medication management for possible cholecystitis.  No further surgical intervention at this time necessary.  With this the patient did have atrial fibrillation with rapid ventricular rate with some periods of apparent bradycardia prior to admission to the hospital.  Currently telemetry shows heart rate control of 100 bpm at rest.  There has been no evidence of sick sinus syndrome and/or significant bradycardia throughout her hospitalization by telemetry.  She has been placed on appropriate medication management for possible conversion to normal rhythm including amiodarone metoprolol combination and appears to be tolerating fairly well  Review of Systems: Positive for: Shortness of breath palpitations Negative for: Vision change, hearing change, syncope, dizziness, nausea, vomiting,diarrhea, bloody stool, stomach pain, cough, congestion, diaphoresis, urinary frequency, urinary pain,skin lesions, skin rashes Others previously listed  Objective: Telemetry: Atrial fibrillation with variable heart rate Physical Exam: Blood pressure 116/84, pulse 99, temperature (!) 97.5 F (36.4 C), temperature source Oral, resp. rate 20, height 5\' 6"  (1.676 m), weight 90.1 kg, SpO2 99 %. Body mass index is 32.05 kg/m. General: Well developed, well nourished, in no acute distress. Head: Normocephalic, atraumatic, sclera non-icteric, no xanthomas, nares are without discharge. Neck: No apparent masses Lungs: Normal respirations with no wheezes, no rhonchi, no rales , few crackles   Heart: Irregular rate and rhythm, normal S1 S2, no murmur, no rub,  no gallop, PMI is normal size and placement, carotid upstroke normal without bruit, jugular venous pressure normal Abdomen: Soft, non-tender, non-distended with normoactive bowel sounds. No hepatosplenomegaly. Abdominal aorta is normal size without bruit Extremities: No edema, no clubbing, no cyanosis, no ulcers,  Peripheral: 2+ radial, 2+ femoral, 2+ dorsal pedal pulses Neuro: Alert and oriented. Moves all extremities spontaneously. Psych:  Responds to questions appropriately with a normal affect.   Intake/Output Summary (Last 24 hours) at 02/22/2020 1057 Last data filed at 02/22/2020 1000 Gross per 24 hour  Intake 1310.09 ml  Output 301 ml  Net 1009.09 ml    Inpatient Medications:  . amiodarone  400 mg Oral BID  . apixaban  5 mg Oral BID  . aspirin EC  81 mg Oral Daily  . calcium carbonate  400 mg of elemental calcium Oral BID  . Chlorhexidine Gluconate Cloth  6 each Topical Daily  . metoprolol tartrate  100 mg Oral BID  . metroNIDAZOLE  500 mg Oral Q8H  . multivitamin with minerals  1 tablet Oral Daily   Infusions:  . ceFEPime (MAXIPIME) IV 2 g (02/22/20 0929)    Labs: Recent Labs    02/21/20 0630 02/22/20 0623  NA 129* 131*  K 4.7 4.9  CL 99 99  CO2 22 23  GLUCOSE 181* 203*  BUN 20 26*  CREATININE 0.88 0.94  CALCIUM 8.7* 8.9  MG  --  2.3   Recent Labs    02/20/20 0511 02/21/20 0630  AST 37 24  ALT 86* 67*  ALKPHOS 95 96  BILITOT 0.8 1.0  PROT 6.7 6.7  ALBUMIN 3.3* 3.3*   Recent Labs    02/21/20 0630 02/22/20 0623  WBC 10.7* 12.3*  NEUTROABS  --  10.1*  HGB 14.0 14.2  HCT 42.7 43.1  MCV 87.0 87.6  PLT 219 241   No results for input(s): CKTOTAL, CKMB, TROPONINI in the last 72 hours. Invalid input(s): POCBNP No results for input(s): HGBA1C in the last 72 hours.   Weights: Filed Weights   02/20/20 0459 02/20/20 1405 02/21/20 0314  Weight: 82.2 kg 86.5 kg 90.1 kg     Radiology/Studies:  CT ABDOMEN PELVIS W CONTRAST  Result Date:  02/18/2020 CLINICAL DATA:  Diverticulitis suspected. Nausea and vomiting. Rapid heart rate. EXAM: CT ABDOMEN AND PELVIS WITH CONTRAST TECHNIQUE: Multidetector CT imaging of the abdomen and pelvis was performed using the standard protocol following bolus administration of intravenous contrast. CONTRAST:  80mL OMNIPAQUE IOHEXOL 300 MG/ML  SOLN COMPARISON:  August 01, 2018 FINDINGS: Lower chest: There is a moderate to large right-sided pleural effusion again identified. There is a smaller left effusion, larger in the interval. Atelectasis is associated with the effusions. Cardiomegaly persists. No other abnormalities in the lower chest. Hepatobiliary: There is new gallbladder wall thickening. Cholelithiasis is identified. There is increased attenuation in the fat of the porta hepatis adjacent to the gallbladder. Hepatic steatosis. No hepatic masses. The portal vein is patent. Pancreas: Fatty deposition in the neck of the pancreas. No mass or evidence of pancreatitis. Spleen: Normal in size without focal abnormality. Adrenals/Urinary Tract: Adrenal glands are normal. No renal stones, hydronephrosis, or perinephric stranding. No renal masses are noted. No ureterectasis or ureteral stones. The bladder is normal. Stomach/Bowel: The stomach and small bowel are normal. Scattered colonic diverticulosis is seen without diverticulitis. The appendix is not seen but there is no secondary evidence of appendicitis. Vascular/Lymphatic: Calcified atherosclerosis is seen in the nonaneurysmal aorta. No suspicious adenopathy. Reproductive: Status post hysterectomy. No adnexal masses. Other: Fluid in the pelvis is likely reactive to the gallbladder findings described above. Musculoskeletal: No acute or significant osseous findings. IMPRESSION: 1. New gallbladder wall thickening and adjacent fat stranding. Cholelithiasis. The constellation of findings is concerning for acute cholecystitis. Recommend right upper quadrant ultrasound. 2. There  is a moderate to large right-sided pleural effusion again identified. The smaller left effusion is larger in the interval. 3. Scattered colonic diverticulosis without diverticulitis. 4. Fluid in the pelvis is likely reactive to the gallbladder findings. Electronically Signed   By: Gerome Sam III M.D   On: 02/18/2020 11:28   DG Chest Port 1 View  Result Date: 02/19/2020 CLINICAL DATA:  US guided thoracentesis Findings: Removed 1000 ml from right chest. EXAM: PORTABLE CHEST - 1 VIEW COMPARISON:  02/18/2020 FINDINGS: No pneumothorax. Persistent bilateral pleural effusions. Atelectasis/consolidation in the lung bases left greater than right. Perihilar interstitial opacities right greater than left as before. Heart size upper limits normal for technique. Aortic Atherosclerosis (ICD10-170.0). Visualized bones unremarkable. IMPRESSION: No pneumothorax post thoracentesis. Electronically Signed   By: Corlis Leak M.D.   On: 02/19/2020 09:39   DG Chest Portable 1 View  Result Date: 02/18/2020 CLINICAL DATA:  Chest pain.  Tachycardia. EXAM: PORTABLE CHEST 1 VIEW COMPARISON:  Jul 30, 2018. FINDINGS: Layering right greater than left pleural effusions. Hazy opacity the right lung, favored to reflect layering fluid. Additional more focal opacities at the left lung base. Mild diffuse interstitial prominence. No visible pneumothorax on this limited portable supine radiograph. Biapical pleuroparenchymal scarring. Enlarged cardiac silhouette. Aortic atherosclerosis. No acute osseous abnormality IMPRESSION: 1. Layering right greater than left pleural effusions. Mild interstitial prominence, suggestive of mild interstitial edema. 2. Left basilar opacities may represent atelectasis, aspiration, and/or pneumonia. 3. Cardiomegaly. Electronically Signed   By: Gelene Mink  Barnett Applebaum MD   On: 02/18/2020 10:15   US Abdomen Limited RUQ (LIVER/GB)  Result Date: 02/18/2020 CLINICAL DATA:  Right upper quadrant abdominal pain. EXAM:  ULTRASOUND ABDOMEN LIMITED RIGHT UPPER QUADRANT COMPARISON:  CT from same day FINDINGS: Gallbladder: There is gallbladder wall thickening and pericholecystic free fluid. Multiple gallstones are noted. The sonographic Eulah Pont sign is negative. Common bile duct: Diameter: 3 mm Liver: No focal lesion identified. Within normal limits in parenchymal echogenicity. Portal vein is patent on color Doppler imaging with normal direction of blood flow towards the liver. Other: There is a right-sided pleural effusion. IMPRESSION: 1. Findings are equivocal for calculus cholecystitis. There are gallstones in the presence of gallbladder wall thickening and pericholecystic free fluid, however the sonographic Eulah Pont sign is reported as negative. Consider HIDA scan for further evaluation. 2. Large right-sided pleural effusion incidentally noted. Electronically Signed   By: Katherine Mantle M.D.   On: 02/18/2020 15:08   US THORACENTESIS ASP PLEURAL SPACE W/IMG GUIDE  Result Date: 02/19/2020 INDICATION: 86 year old with pleural effusions. EXAM: ULTRASOUND GUIDED RIGHT THORACENTESIS MEDICATIONS: None. COMPLICATIONS: None immediate. PROCEDURE: An ultrasound guided thoracentesis was thoroughly discussed with the patient and questions answered. The benefits, risks, alternatives and complications were also discussed. The patient understands and wishes to proceed with the procedure. Written consent was obtained. Ultrasound was performed to localize and mark an adequate pocket of fluid in the right chest. The area was then prepped and draped in the normal sterile fashion. 1% Lidocaine was used for local anesthesia. Under ultrasound guidance a 6 Fr Safe-T-Centesis catheter was introduced. Thoracentesis was performed. The catheter was removed and a dressing applied. FINDINGS: A total of approximately 1 L of yellow fluid was removed. Samples were sent to the laboratory as requested by the clinical team. Negative for pneumothorax on the  follow-up chest radiograph. IMPRESSION: Successful ultrasound guided right thoracentesis yielding 1 L of pleural fluid. Electronically Signed   By: Richarda Overlie M.D.   On: 02/19/2020 12:02     Assessment and Recommendation  86 y.o. female with abdominal discomfort and possible cholecystitis now improving on appropriate medication management and acute atrial fibrillation with rapid ventricular rate now with better heart rate control with appropriate medication management and no current evidence of congestive heart failure or myocardial infarction 1.  Continue current combination of amiodarone for possible rhythm change and spontaneous conversion to normal sinus rhythm while using current dose of metoprolol for heart rate control 2.  Begin ambulation and follow for stability of this treatment option as per above with possible discharged home tomorrow if patient has good control with no further symptoms 3.  Continuation of Eliquis 5 mg twice per day for further risk reduction in stroke with atrial fibrillation 4.  No further cardiac diagnostics necessary at this time  Signed, Arnoldo Hooker M.D. FACC

## 2020-02-22 NOTE — Progress Notes (Addendum)
PROGRESS NOTE    Lisa HatchetJoanne Moon  WUJ:[id number] DOB: 10-13-1932 DOA: 02/18/2020 PCP: Lynnea FerrierKlein, Bert J III, MD   Chief complaint. Nausea vomiting Brief Narrative:  Lisa EvensJoanne Ryanis a 86 y.o.femalewith medical history significant ofhypertension, hyperlipidemia, atrial fibrillation, who presents with nausea vomiting palpitation.  Upon arriving the emergency room, patient had atrial fibrillation with rapid  response, received a dose of Cardizem, he developed hypotension and bradycardia.  Improved after giving IV fluids. Her CT scan showed cholelithiasis with possible acute cholecystitis, right upper quadrant showed possible cholecystitis.  Patient treated with antibiotics with cefepime and Flagyl. Also eval by general surgery, she is not a candidate for surgery.    Assessment & Plan:   Active Problems:   Acute calculous cholecystitis   Severe sepsis with septic shock (HCC)   Atrial fibrillation with RVR (HCC)   Bradycardia   HLD (hyperlipidemia)   Hypertension   Pleural effusion   Hyperkalemia   AKI (acute kidney injury) (HCC)   Elevated troponin   Hyponatremia  #1. Severe sepsis with septic shock secondary to acute calculus cholecystitis. Acute calculus cholecystitis. Patient does not need surgery. Condition improving.  Patient developed significant nausea vomiting after taking Flagyl. Discontinue Flagyl and cefepime, continue oral Cipro for the next 3 days.   2. Nausea vomiting with severe constipation. Patient had multiple bowel movements after enema. Nausea vomiting seem to be improving. But nausea recurred after given Flagyl.  Antibiotics changed.   Patient had a multiple bowel movement after enema yesterday.  Currently still has some loose stools, I will change scheduled senna to as needed. Due to poor p.o. intake, will start fluids.  3. Hyponatremia. Restart fluids.  4. Paroxysmal atrial fibrillation with RVR. Followed by cardiology, heart rate is still fast.  Beta-blocker increasing dose. Eliquis  5. Pleural effusion. Status post thoracentesis.  6. Essential hypertension. Continue metoprolol    DVT prophylaxis: Eliquis Code Status: Full Family Communication: Daughter updated. Disposition Plan:  .   Status is: Inpatient  Remains inpatient appropriate because:Inpatient level of care appropriate due to severity of illness   Dispo: The patient is from: Home              Anticipated d/c is to: Home              Anticipated d/c date is: 1 day              Patient currently is not medically stable to d/c.        I/O last 3 completed shifts: In: 1407.5 [P.O.:360; I.V.:752.9; IV Piggyback:294.7] Out: 1551 [Urine:1550; Stool:1] No intake/output data recorded.     Consultants:   Cardiology  Procedures: None  Antimicrobials: Cefepime and Flagyl  Subjective: Patient had a multiple bowel movements after giving enema last night. Nausea vomiting seem to be improving. Patient still has some palpitation with rapid heart rate. No chest pain. No short of breath. No fever or chills. Patient still feels dizzy when she stands up.  Objective: Vitals:   02/21/20 2014 02/21/20 2226 02/22/20 0327 02/22/20 0808  BP: (!) 128/110 132/79 100/69 116/84  Pulse: (!) 106 (!) 115 (!) 108 99  Resp:   20   Temp:   97.7 F (36.5 C) (!) 97.5 F (36.4 C)  TempSrc:   Oral Oral  SpO2: (!) 88%  94% 99%  Weight:      Height:        Intake/Output Summary (Last 24 hours) at 02/22/2020 0956 Last data filed at 02/22/2020 [id number] Gross  per 24 hour  Intake 1310.09 ml  Output 426 ml  Net 884.09 ml   Filed Weights   02/20/20 0459 02/20/20 1405 02/21/20 0314  Weight: 82.2 kg 86.5 kg 90.1 kg    Examination:  General exam: Appears calm and comfortable  Respiratory system: Clear to auscultation. Respiratory effort normal. Cardiovascular system: Irregular, tachycardic, no JVD, murmurs, rubs, gallops or clicks. No pedal edema. Gastrointestinal  system: Abdomen is nondistended, soft and nontender. No organomegaly or masses felt. Normal bowel sounds heard. Central nervous system: Alert and oriented. No focal neurological deficits. Extremities: Symmetric  Skin: No rashes, lesions or ulcers Psychiatry:  Mood & affect appropriate.     Data Reviewed: I have personally reviewed following labs and imaging studies  CBC: Recent Labs  Lab 02/18/20 0921 02/19/20 0645 02/20/20 0511 02/21/20 0630 02/22/20 0623  WBC 12.7* 11.9* 10.8* 10.7* 12.3*  NEUTROABS  --   --   --   --  10.1*  HGB 15.1* 13.4 13.4 14.0 14.2  HCT 46.0 39.9 41.3 42.7 43.1  MCV 85.7 86.6 87.7 87.0 87.6  PLT 248 196 215 219 241   Basic Metabolic Panel: Recent Labs  Lab 02/18/20 0921 02/18/20 1507 02/19/20 0645 02/20/20 0511 02/21/20 0630 02/22/20 0623  NA 126* 129* 131* 134* 129* 131*  K 5.2* 5.3* 4.3 4.5 4.7 4.9  CL 94* 94* 99 100 99 99  CO2 18* 25 23 24 22 23   GLUCOSE 282* 198* 160* 147* 181* 203*  BUN 25* 23 21 20 20  26*  CREATININE 1.05* 1.00 0.92 0.82 0.88 0.94  CALCIUM 8.9 8.9 7.8* 8.6* 8.7* 8.9  MG 2.1  --   --   --   --  2.3   GFR: Estimated Creatinine Clearance: 49.5 mL/min (by C-G formula based on SCr of 0.94 mg/dL). Liver Function Tests: Recent Labs  Lab 02/15/20 1950 02/18/20 0921 02/20/20 0511 02/21/20 0630  AST 40 114* 37 24  ALT 35 143* 86* 67*  ALKPHOS 88 124 95 96  BILITOT 1.1 1.1 0.8 1.0  PROT 7.8 7.6 6.7 6.7  ALBUMIN 3.6 3.6 3.3* 3.3*   Recent Labs  Lab 02/15/20 1950  LIPASE 20   No results for input(s): AMMONIA in the last 168 hours. Coagulation Profile: Recent Labs  Lab 02/18/20 0921  INR 1.1   Cardiac Enzymes: No results for input(s): CKTOTAL, CKMB, CKMBINDEX, TROPONINI in the last 168 hours. BNP (last 3 results) No results for input(s): PROBNP in the last 8760 hours. HbA1C: No results for input(s): HGBA1C in the last 72 hours. CBG: Recent Labs  Lab 02/19/20 0835 02/19/20 1101 02/20/20 0739  02/21/20 0723 02/22/20 0810  GLUCAP 162* 143* 151* 159* 169*   Lipid Profile: No results for input(s): CHOL, HDL, LDLCALC, TRIG, CHOLHDL, LDLDIRECT in the last 72 hours. Thyroid Function Tests: No results for input(s): TSH, T4TOTAL, FREET4, T3FREE, THYROIDAB in the last 72 hours. Anemia Panel: No results for input(s): VITAMINB12, FOLATE, FERRITIN, TIBC, IRON, RETICCTPCT in the last 72 hours. Sepsis Labs: Recent Labs  Lab 02/18/20 1101 02/18/20 1235 02/18/20 1507 02/19/20 1521 02/19/20 1821  PROCALCITON  --   --  <0.10  --   --   LATICACIDVEN 4.5* 3.7*  --  2.2* 1.6    Recent Results (from the past 240 hour(s))  Resp Panel by RT-PCR (Flu A&B, Covid) Nasopharyngeal Swab     Status: None   Collection Time: 02/18/20  9:21 AM   Specimen: Nasopharyngeal Swab; Nasopharyngeal(NP) swabs in vial transport medium  Result Value Ref Range Status   SARS Coronavirus 2 by RT PCR NEGATIVE NEGATIVE Final    Comment: (NOTE) SARS-CoV-2 target nucleic acids are NOT DETECTED.  The SARS-CoV-2 RNA is generally detectable in upper respiratory specimens during the acute phase of infection. The lowest concentration of SARS-CoV-2 viral copies this assay can detect is 138 copies/mL. A negative result does not preclude SARS-Cov-2 infection and should not be used as the sole basis for treatment or other patient management decisions. A negative result may occur with  improper specimen collection/handling, submission of specimen other than nasopharyngeal swab, presence of viral mutation(s) within the areas targeted by this assay, and inadequate number of viral copies(<138 copies/mL). A negative result must be combined with clinical observations, patient history, and epidemiological information. The expected result is Negative.  Fact Sheet for Patients:  BloggerCourse.com  Fact Sheet for Healthcare Providers:  SeriousBroker.it  This test is no t yet  approved or cleared by the [place] FDA and  has been authorized for detection and/or diagnosis of SARS-CoV-2 by FDA under an Emergency Use Authorization (EUA). This EUA will remain  in effect (meaning this test can be used) for the duration of the COVID-19 declaration under Section 564(b)(1) of the Act, 21 U.S.C.section 360bbb-3(b)(1), unless the authorization is terminated  or revoked sooner.       Influenza A by PCR NEGATIVE NEGATIVE Final   Influenza B by PCR NEGATIVE NEGATIVE Final    Comment: (NOTE) The Xpert Xpress SARS-CoV-2/FLU/RSV plus assay is intended as an aid in the diagnosis of influenza from Nasopharyngeal swab specimens and should not be used as a sole basis for treatment. Nasal washings and aspirates are unacceptable for Xpert Xpress SARS-CoV-2/FLU/RSV testing.  Fact Sheet for Patients: BloggerCourse.com  Fact Sheet for Healthcare Providers: SeriousBroker.it  This test is not yet approved or cleared by the [place] FDA and has been authorized for detection and/or diagnosis of SARS-CoV-2 by FDA under an Emergency Use Authorization (EUA). This EUA will remain in effect (meaning this test can be used) for the duration of the COVID-19 declaration under Section 564(b)(1) of the Act, 21 U.S.C. section 360bbb-3(b)(1), unless the authorization is terminated or revoked.  Performed at [facility], [address] [place], [place] [address]   Blood culture (routine x 2)     Status: None (Preliminary result)   Collection Time: 02/18/20 11:01 AM   Specimen: BLOOD  Result Value Ref Range Status   Specimen Description BLOOD RIGHT ANTECUBITAL  Final   Special Requests AEROBIC BOTTLE ONLY Blood Culture adequate volume  Final   Culture   Final    NO GROWTH 4 DAYS Performed at [facility], [address], [place], [place] [address]    Report Status PENDING  Incomplete  Blood culture (routine  x 2)     Status: None (Preliminary result)   Collection Time: 02/18/20 11:01 AM   Specimen: BLOOD  Result Value Ref Range Status   Specimen Description BLOOD LEFT ANTECUBITAL  Final   Special Requests   Final    BOTTLES DRAWN AEROBIC AND ANAEROBIC Blood Culture adequate volume   Culture   Final    NO GROWTH 4 DAYS Performed at [facility], [address] [place], [place] [address]    Report Status PENDING  Incomplete  Body fluid culture     Status: None (Preliminary result)   Collection Time: 02/19/20  9:12 AM   Specimen: PATH Cytology Pleural fluid  Result Value Ref Range  Status   Specimen Description   Final    PLEURAL Performed at [facility], [address] [place], [place] [address]    Special Requests   Final    NONE Performed at [facility], [address] [place], [place] [address]    Gram Stain   Final    WBC PRESENT, PREDOMINANTLY MONONUCLEAR NO ORGANISMS SEEN CYTOSPIN SMEAR    Culture   Final    NO GROWTH 2 DAYS Performed at [facility], [address], [place], [place] [address]    Report Status PENDING  Incomplete  MRSA PCR Screening     Status: None   Collection Time: 02/19/20 11:18 AM   Specimen: Nasopharyngeal  Result Value Ref Range Status   MRSA by PCR NEGATIVE NEGATIVE Final    Comment:        The GeneXpert MRSA Assay (FDA approved for NASAL specimens only), is one component of a comprehensive MRSA colonization surveillance program. It is not intended to diagnose MRSA infection nor to guide or monitor treatment for MRSA infections. Performed at [facility], [address], [place], [place] [address]          Radiology Studies: No results found.      Scheduled Meds: . amiodarone  400 mg Oral BID  . apixaban  5 mg Oral BID  . aspirin EC  81 mg Oral Daily  . calcium carbonate  400 mg of elemental calcium Oral BID  . Chlorhexidine Gluconate Cloth  6 each Topical Daily  .  metoprolol tartrate  100 mg Oral BID  . metroNIDAZOLE  500 mg Oral Q8H  . multivitamin with minerals  1 tablet Oral Daily   Continuous Infusions: . ceFEPime (MAXIPIME) IV 2 g (02/22/20 0929)     LOS: 4 days    Time spent: 27 minutes    Marrion Coy, MD [facility]   To contact the attending provider between 7A-7P or the covering provider during after hours 7P-7A, please log into the web site [email or web address] and access using universal [place] password for that web site. If you do not have the password, please call the hospital operator.  02/22/2020, 9:56 AM

## 2020-02-23 DIAGNOSIS — R6521 Severe sepsis with septic shock: Secondary | ICD-10-CM

## 2020-02-23 DIAGNOSIS — A419 Sepsis, unspecified organism: Principal | ICD-10-CM

## 2020-02-23 DIAGNOSIS — J90 Pleural effusion, not elsewhere classified: Secondary | ICD-10-CM

## 2020-02-23 LAB — CULTURE, BLOOD (ROUTINE X 2)
Culture: NO GROWTH
Culture: NO GROWTH
Special Requests: ADEQUATE
Special Requests: ADEQUATE

## 2020-02-23 LAB — CBC WITH DIFFERENTIAL/PLATELET
Abs Immature Granulocytes: 0.11 10*3/uL — ABNORMAL HIGH (ref 0.00–0.07)
Basophils Absolute: 0 10*3/uL (ref 0.0–0.1)
Basophils Relative: 0 %
Eosinophils Absolute: 0 10*3/uL (ref 0.0–0.5)
Eosinophils Relative: 0 %
HCT: 42 % (ref 36.0–46.0)
Hemoglobin: 13.9 g/dL (ref 12.0–15.0)
Immature Granulocytes: 1 %
Lymphocytes Relative: 11 %
Lymphs Abs: 1.4 10*3/uL (ref 0.7–4.0)
MCH: 28.8 pg (ref 26.0–34.0)
MCHC: 33.1 g/dL (ref 30.0–36.0)
MCV: 87.1 fL (ref 80.0–100.0)
Monocytes Absolute: 1 10*3/uL (ref 0.1–1.0)
Monocytes Relative: 8 %
Neutro Abs: 9.9 10*3/uL — ABNORMAL HIGH (ref 1.7–7.7)
Neutrophils Relative %: 80 %
Platelets: 223 10*3/uL (ref 150–400)
RBC: 4.82 MIL/uL (ref 3.87–5.11)
RDW: 15.2 % (ref 11.5–15.5)
WBC: 12.5 10*3/uL — ABNORMAL HIGH (ref 4.0–10.5)
nRBC: 0 % (ref 0.0–0.2)

## 2020-02-23 LAB — BASIC METABOLIC PANEL
Anion gap: 7 (ref 5–15)
BUN: 36 mg/dL — ABNORMAL HIGH (ref 8–23)
CO2: 22 mmol/L (ref 22–32)
Calcium: 8.5 mg/dL — ABNORMAL LOW (ref 8.9–10.3)
Chloride: 100 mmol/L (ref 98–111)
Creatinine, Ser: 0.99 mg/dL (ref 0.44–1.00)
GFR, Estimated: 56 mL/min — ABNORMAL LOW (ref >60)
Glucose, Bld: 173 mg/dL — ABNORMAL HIGH (ref 70–99)
Potassium: 5 mmol/L (ref 3.5–5.1)
Sodium: 129 mmol/L — ABNORMAL LOW (ref 135–145)

## 2020-02-23 LAB — GLUCOSE, CAPILLARY
Glucose-Capillary: 178 mg/dL — ABNORMAL HIGH (ref 70–99)
Glucose-Capillary: 181 mg/dL — ABNORMAL HIGH (ref 70–99)
Glucose-Capillary: 220 mg/dL — ABNORMAL HIGH (ref 70–99)

## 2020-02-23 LAB — MAGNESIUM: Magnesium: 2.3 mg/dL (ref 1.7–2.4)

## 2020-02-23 MED ORDER — SODIUM CHLORIDE 1 G PO TABS
1.0000 g | ORAL_TABLET | Freq: Every day | ORAL | Status: DC
  Administered 2020-02-23 – 2020-02-24 (×2): 1 g via ORAL
  Filled 2020-02-23 (×2): qty 1

## 2020-02-23 MED ORDER — AMIODARONE HCL 200 MG PO TABS
200.0000 mg | ORAL_TABLET | Freq: Two times a day (BID) | ORAL | Status: DC
  Administered 2020-02-23 – 2020-02-24 (×3): 200 mg via ORAL
  Filled 2020-02-23 (×4): qty 1

## 2020-02-23 MED ORDER — SODIUM CHLORIDE 0.9% FLUSH
10.0000 mL | Freq: Two times a day (BID) | INTRAVENOUS | Status: DC
  Administered 2020-02-23: 21:00:00 10 mL via INTRAVENOUS

## 2020-02-23 MED ORDER — METOPROLOL TARTRATE 50 MG PO TABS
150.0000 mg | ORAL_TABLET | Freq: Two times a day (BID) | ORAL | Status: DC
  Administered 2020-02-23 – 2020-02-24 (×3): 150 mg via ORAL
  Filled 2020-02-23 (×4): qty 3

## 2020-02-23 NOTE — Progress Notes (Signed)
PROGRESS NOTE    Lisa Moon  QMG:[id number] DOB: 12-Feb-1933 DOA: 02/18/2020 PCP: Lynnea Ferrier, MD   Chief complaint. Nausea vomiting Brief Narrative:  Lisa Ryanis a 86 y.o.femalewith medical history significant ofhypertension, hyperlipidemia, atrial fibrillation, who presents with nausea vomiting palpitation.Upon arriving the emergency room, patient had atrial fibrillation with rapid  response, received a dose of Cardizem, he developed hypotension and bradycardia. Improved after giving IV fluids. Her CT scan showed cholelithiasis with possible acute cholecystitis, right upper quadrant showed possible cholecystitis. Patient treated with antibiotics with cefepime and Flagyl. Also eval by general surgery, she is not a candidate for surgery.    Assessment & Plan:   Active Problems:   Acute calculous cholecystitis   Severe sepsis with septic shock (HCC)   Atrial fibrillation with RVR (HCC)   Bradycardia   HLD (hyperlipidemia)   Hypertension   Pleural effusion   Hyperkalemia   AKI (acute kidney injury) (HCC)   Elevated troponin   Hyponatremia  #1. Severe sepsis with septic shock secondary to acute calculus cholecystitis. Acute calculus cholecystitis. Condition improved.  Patient does not have any abdominal pain or fever. General surgery has evaluated patient, believe condition has improved. Change to Cipro orally yesterday, will continue for next 2 days.  2.  Nausea vomiting. Initially secondary to the patient. Better after multiple bowel movements. Mild nausea vomiting due to medication.  Flagyl discontinued.  #3.  Hyponatremia. Sodium level slightly low today, no longer dehydrated, discontinue fluids. Start oral salt tablet 1 g daily.  #4.  Paroxysmal atrial fibrillation with RVR. Followed by cardiology.  Heart rate much better.  5.  Pleural effusion. Status post paracentesis.  6.  Essential hypertension    DVT prophylaxis: Eliquis Code Status:  Full Family Communication: Daughter at bedside. Disposition Plan:  .   Status is: Inpatient  Remains inpatient appropriate because:Inpatient level of care appropriate due to severity of illness   Dispo: The patient is from: Home              Anticipated d/c is to: Home              Anticipated d/c date is: 1 day              Patient currently is not medically stable to d/c.        I/O last 3 completed shifts: In: 1396.1 [P.O.:600; I.V.:596.1; IV Piggyback:200] Out: 201 [Urine:200; Stool:1] No intake/output data recorded.     Consultants:   Cardiology  Procedures: None  Antimicrobials: Cipro  Subjective: Patient nausea vomiting seem to be improving, she tolerated breakfast without nausea. No abdominal pain. No fever chills per No dysuria hematuria. No short of breath or cough.  Objective: Vitals:   02/22/20 1200 02/22/20 1936 02/23/20 0350 02/23/20 0807  BP:  (!) 104/91 99/70 (!) 113/97  Pulse: 100 (!) 102 79 75  Resp:  17 18 18   Temp:  (!) 97.4 F (36.3 C) (!) 97.4 F (36.3 C) 97.6 F (36.4 C)  TempSrc:  Oral Oral Oral  SpO2:  96% 94% 96%  Weight:   90.6 kg   Height:        Intake/Output Summary (Last 24 hours) at 02/23/2020 1124 Last data filed at 02/23/2020 0500 Gross per 24 hour  Intake 354.07 ml  Output 200 ml  Net 154.07 ml   Filed Weights   02/20/20 1405 02/21/20 0314 02/23/20 0350  Weight: 86.5 kg 90.1 kg 90.6 kg    Examination:  General exam:  Appears calm and comfortable  Respiratory system: Clear to auscultation. Respiratory effort normal. Cardiovascular system: Irregular. No JVD, murmurs, rubs, gallops or clicks. No pedal edema. Gastrointestinal system: Abdomen is nondistended, soft and nontender. No organomegaly or masses felt. Normal bowel sounds heard. Central nervous system: Alert and oriented x3. No focal neurological deficits. Extremities: Symmetric 5 x 5 power. Skin: No rashes, lesions or ulcers Psychiatry: Judgement and  insight appear normal. Mood & affect appropriate.     Data Reviewed: I have personally reviewed following labs and imaging studies  CBC: Recent Labs  Lab 02/19/20 0645 02/20/20 0511 02/21/20 0630 02/22/20 0623 02/23/20 0358  WBC 11.9* 10.8* 10.7* 12.3* 12.5*  NEUTROABS  --   --   --  10.1* 9.9*  HGB 13.4 13.4 14.0 14.2 13.9  HCT 39.9 41.3 42.7 43.1 42.0  MCV 86.6 87.7 87.0 87.6 87.1  PLT 196 215 219 241 223   Basic Metabolic Panel: Recent Labs  Lab 02/18/20 0921 02/18/20 1507 02/19/20 0645 02/20/20 0511 02/21/20 0630 02/22/20 0623 02/23/20 0358  NA 126*   < > 131* 134* 129* 131* 129*  K 5.2*   < > 4.3 4.5 4.7 4.9 5.0  CL 94*   < > 99 100 99 99 100  CO2 18*   < > 23 24 22 23 22   GLUCOSE 282*   < > 160* 147* 181* 203* 173*  BUN 25*   < > 21 20 20  26* 36*  CREATININE 1.05*   < > 0.92 0.82 0.88 0.94 0.99  CALCIUM 8.9   < > 7.8* 8.6* 8.7* 8.9 8.5*  MG 2.1  --   --   --   --  2.3 2.3   < > = values in this interval not displayed.   GFR: Estimated Creatinine Clearance: 47.1 mL/min (by C-G formula based on SCr of 0.99 mg/dL). Liver Function Tests: Recent Labs  Lab 02/18/20 0921 02/20/20 0511 02/21/20 0630  AST 114* 37 24  ALT 143* 86* 67*  ALKPHOS 124 95 96  BILITOT 1.1 0.8 1.0  PROT 7.6 6.7 6.7  ALBUMIN 3.6 3.3* 3.3*   No results for input(s): LIPASE, AMYLASE in the last 168 hours. No results for input(s): AMMONIA in the last 168 hours. Coagulation Profile: Recent Labs  Lab 02/18/20 0921  INR 1.1   Cardiac Enzymes: No results for input(s): CKTOTAL, CKMB, CKMBINDEX, TROPONINI in the last 168 hours. BNP (last 3 results) No results for input(s): PROBNP in the last 8760 hours. HbA1C: No results for input(s): HGBA1C in the last 72 hours. CBG: Recent Labs  Lab 02/19/20 1101 02/20/20 0739 02/21/20 0723 02/22/20 0810 02/23/20 0809  GLUCAP 143* 151* 159* 169* 178*   Lipid Profile: No results for input(s): CHOL, HDL, LDLCALC, TRIG, CHOLHDL, LDLDIRECT  in the last 72 hours. Thyroid Function Tests: No results for input(s): TSH, T4TOTAL, FREET4, T3FREE, THYROIDAB in the last 72 hours. Anemia Panel: No results for input(s): VITAMINB12, FOLATE, FERRITIN, TIBC, IRON, RETICCTPCT in the last 72 hours. Sepsis Labs: Recent Labs  Lab 02/18/20 1101 02/18/20 1235 02/18/20 1507 02/19/20 1521 02/19/20 1821  PROCALCITON  --   --  <0.10  --   --   LATICACIDVEN 4.5* 3.7*  --  2.2* 1.6    Recent Results (from the past 240 hour(s))  Resp Panel by RT-PCR (Flu A&B, Covid) Nasopharyngeal Swab     Status: None   Collection Time: 02/18/20  9:21 AM   Specimen: Nasopharyngeal Swab; Nasopharyngeal(NP) swabs in vial transport medium  Result Value Ref Range Status   SARS Coronavirus 2 by RT PCR NEGATIVE NEGATIVE Final    Comment: (NOTE) SARS-CoV-2 target nucleic acids are NOT DETECTED.  The SARS-CoV-2 RNA is generally detectable in upper respiratory specimens during the acute phase of infection. The lowest concentration of SARS-CoV-2 viral copies this assay can detect is 138 copies/mL. A negative result does not preclude SARS-Cov-2 infection and should not be used as the sole basis for treatment or other patient management decisions. A negative result may occur with  improper specimen collection/handling, submission of specimen other than nasopharyngeal swab, presence of viral mutation(s) within the areas targeted by this assay, and inadequate number of viral copies(<138 copies/mL). A negative result must be combined with clinical observations, patient history, and epidemiological information. The expected result is Negative.  Fact Sheet for Patients:  BloggerCourse.comhttps://www.fda.gov/media/152166/download  Fact Sheet for Healthcare Providers:  SeriousBroker.ithttps://www.fda.gov/media/152162/download  This test is no t yet approved or cleared by the [place] FDA and  has been authorized for detection and/or diagnosis of SARS-CoV-2 by FDA under an Emergency Use  Authorization (EUA). This EUA will remain  in effect (meaning this test can be used) for the duration of the COVID-19 declaration under Section 564(b)(1) of the Act, 21 U.S.C.section 360bbb-3(b)(1), unless the authorization is terminated  or revoked sooner.       Influenza A by PCR NEGATIVE NEGATIVE Final   Influenza B by PCR NEGATIVE NEGATIVE Final    Comment: (NOTE) The Xpert Xpress SARS-CoV-2/FLU/RSV plus assay is intended as an aid in the diagnosis of influenza from Nasopharyngeal swab specimens and should not be used as a sole basis for treatment. Nasal washings and aspirates are unacceptable for Xpert Xpress SARS-CoV-2/FLU/RSV testing.  Fact Sheet for Patients: BloggerCourse.comhttps://www.fda.gov/media/152166/download  Fact Sheet for Healthcare Providers: SeriousBroker.ithttps://www.fda.gov/media/152162/download  This test is not yet approved or cleared by the [place] FDA and has been authorized for detection and/or diagnosis of SARS-CoV-2 by FDA under an Emergency Use Authorization (EUA). This EUA will remain in effect (meaning this test can be used) for the duration of the COVID-19 declaration under Section 564(b)(1) of the Act, 21 U.S.C. section 360bbb-3(b)(1), unless the authorization is terminated or revoked.  Performed at [facility], [address] [place], [place] [address]   Blood culture (routine x 2)     Status: None   Collection Time: 02/18/20 11:01 AM   Specimen: BLOOD  Result Value Ref Range Status   Specimen Description BLOOD RIGHT ANTECUBITAL  Final   Special Requests AEROBIC BOTTLE ONLY Blood Culture adequate volume  Final   Culture   Final    NO GROWTH 5 DAYS Performed at [facility], [address] [place], [place] [address]    Report Status 02/23/2020 FINAL  Final  Blood culture (routine x 2)     Status: None   Collection Time: 02/18/20 11:01 AM   Specimen: BLOOD  Result Value Ref Range Status   Specimen Description BLOOD LEFT ANTECUBITAL   Final   Special Requests   Final    BOTTLES DRAWN AEROBIC AND ANAEROBIC Blood Culture adequate volume   Culture   Final    NO GROWTH 5 DAYS Performed at [facility], [address] [place], [place] [address]    Report Status 02/23/2020 FINAL  Final  Body fluid culture     Status: None   Collection Time: 02/19/20  9:12 AM   Specimen: PATH Cytology Pleural fluid  Result Value Ref Range Status   Specimen  Description   Final    PLEURAL Performed at [facility], [address] [place], [place] [address]    Special Requests   Final    NONE Performed at [facility], [address] [place], [place] [address]    Gram Stain   Final    WBC PRESENT, PREDOMINANTLY MONONUCLEAR NO ORGANISMS SEEN CYTOSPIN SMEAR    Culture   Final    NO GROWTH 3 DAYS Performed at [facility], [address], [place], [place] [address]    Report Status 02/22/2020 FINAL  Final  MRSA PCR Screening     Status: None   Collection Time: 02/19/20 11:18 AM   Specimen: Nasopharyngeal  Result Value Ref Range Status   MRSA by PCR NEGATIVE NEGATIVE Final    Comment:        The GeneXpert MRSA Assay (FDA approved for NASAL specimens only), is one component of a comprehensive MRSA colonization surveillance program. It is not intended to diagnose MRSA infection nor to guide or monitor treatment for MRSA infections. Performed at [facility], [address], [place], [place] [address]          Radiology Studies: No results found.      Scheduled Meds: . amiodarone  200 mg Oral BID  . apixaban  5 mg Oral BID  . calcium carbonate  400 mg of elemental calcium Oral BID  . Chlorhexidine Gluconate Cloth  6 each Topical Daily  . ciprofloxacin  500 mg Oral BID  . metoprolol tartrate  150 mg Oral BID  . multivitamin with minerals  1 tablet Oral Daily  . pantoprazole  40 mg Oral Daily  . sodium chloride  1 g Oral Daily   Continuous Infusions:   LOS: 5  days    Time spent: 26 minutes    Marrion Coy, MD [facility]   To contact the attending provider between 7A-7P or the covering provider during after hours 7P-7A, please log into the web site [email or web address] and access using universal [place] password for that web site. If you do not have the password, please call the hospital operator.  02/23/2020, 11:24 AM

## 2020-02-23 NOTE — Progress Notes (Signed)
PT Cancellation Note  Patient Details Name: Lisa Moon MRN: [id number] DOB: September 21, 1932   Cancelled Treatment:    Reason Eval/Treat Not Completed: Fatigue/lethargy limiting ability to participate.  Chart reviewed and pt reported that she is too fatigued to participate in therapy today.  Pt reports that she has ambulated with nursing staff today and does not have the strength in order to perform therapeutic exercises or during today's session.  Pt will be attempted to be seen at later date/time as medically appropriate.     Nolon Bussing, PT, DPT 02/23/20, 3:17 PM

## 2020-02-23 NOTE — Progress Notes (Signed)
[facility]  Patient: Lisa HatchetJoanne Moon / Admit Date: 02/18/2020 / Date of Encounter: 02/23/2020, 9:40 AM   Subjective: 12/23.  Patient overall feeling much better than she had on admission.  No current evidence of anginal symptoms or congestive heart failure.  The patient has had full resolution of abdominal discomfort and is on appropriate medication management for possible cholecystitis.  No further surgical intervention at this time necessary.  With this the patient did have atrial fibrillation with rapid ventricular rate with some periods of apparent bradycardia prior to admission to the hospital.  Currently telemetry shows heart rate control of 100 bpm at rest.  There has been no evidence of sick sinus syndrome and/or significant bradycardia throughout her hospitalization by telemetry.  She has been placed on appropriate medication management for possible conversion to normal rhythm including amiodarone metoprolol combination and appears to be tolerating fairly well  12/24.  Patient is heart rate control has been reasonable since yesterday.  This is with a combination of metoprolol and amiodarone.  The patient has had a rash with calcium channel blocker in the past and cannot use this combination.  The patient has had significant amount of nausea which is likely secondary to the highest dose of amiodarone and therefore may need to drop the dose of the amiodarone for better tolerance to spontaneously convert to normal rhythm.  This also may inhibit in the future of use of amiodarone although will continue to try at lower dose at this time.  Heart rate control may be improved by slightly increased dose of metoprolol.  The patient does appear to be tolerating the anticoagulation.  Review of Systems: Positive for: Shortness of breath palpitations Negative for: Vision change, hearing change, syncope, dizziness, nausea, vomiting,diarrhea, bloody stool, stomach pain,  cough, congestion, diaphoresis, urinary frequency, urinary pain,skin lesions, skin rashes Others previously listed  Objective: Telemetry: Atrial fibrillation with variable heart rate Physical Exam: Blood pressure (!) 113/97, pulse 75, temperature 97.6 F (36.4 C), temperature source Oral, resp. rate 18, height 5\' 6"  (1.676 m), weight 90.6 kg, SpO2 96 %. Body mass index is 32.25 kg/m. General: Well developed, well nourished, in no acute distress. Head: Normocephalic, atraumatic, sclera non-icteric, no xanthomas, nares are without discharge. Neck: No apparent masses Lungs: Normal respirations with no wheezes, no rhonchi, no rales , few crackles   Heart: Irregular rate and rhythm, normal S1 S2, no murmur, no rub, no gallop, PMI is normal size and placement, carotid upstroke normal without bruit, jugular venous pressure normal Abdomen: Soft, non-tender, non-distended with normoactive bowel sounds. No hepatosplenomegaly. Abdominal aorta is normal size without bruit Extremities: No edema, no clubbing, no cyanosis, no ulcers,  Peripheral: 2+ radial, 2+ femoral, 2+ dorsal pedal pulses Neuro: Alert and oriented. Moves all extremities spontaneously. Psych:  Responds to questions appropriately with a normal affect.   Intake/Output Summary (Last 24 hours) at 02/23/2020 0940 Last data filed at 02/23/2020 0500 Gross per 24 hour  Intake 594.07 ml  Output 200 ml  Net 394.07 ml    Inpatient Medications:  . amiodarone  200 mg Oral BID  . apixaban  5 mg Oral BID  . calcium carbonate  400 mg of elemental calcium Oral BID  . Chlorhexidine Gluconate Cloth  6 each Topical Daily  . ciprofloxacin  500 mg Oral BID  . metoprolol tartrate  150 mg Oral BID  . multivitamin with minerals  1 tablet Oral Daily  . pantoprazole  40 mg Oral Daily  Infusions:  . sodium chloride 100 mL/hr at 02/23/20 0200    Labs: Recent Labs    02/22/20 0623 02/23/20 0358  NA 131* 129*  K 4.9 5.0  CL 99 100  CO2 23 22   GLUCOSE 203* 173*  BUN 26* 36*  CREATININE 0.94 0.99  CALCIUM 8.9 8.5*  MG 2.3 2.3   Recent Labs    02/21/20 0630  AST 24  ALT 67*  ALKPHOS 96  BILITOT 1.0  PROT 6.7  ALBUMIN 3.3*   Recent Labs    02/22/20 0623 02/23/20 0358  WBC 12.3* 12.5*  NEUTROABS 10.1* 9.9*  HGB 14.2 13.9  HCT 43.1 42.0  MCV 87.6 87.1  PLT 241 223   No results for input(s): CKTOTAL, CKMB, TROPONINI in the last 72 hours. Invalid input(s): POCBNP No results for input(s): HGBA1C in the last 72 hours.   Weights: Filed Weights   02/20/20 1405 02/21/20 0314 02/23/20 0350  Weight: 86.5 kg 90.1 kg 90.6 kg     Radiology/Studies:  CT ABDOMEN PELVIS W CONTRAST  Result Date: 02/18/2020 CLINICAL DATA:  Diverticulitis suspected. Nausea and vomiting. Rapid heart rate. EXAM: CT ABDOMEN AND PELVIS WITH CONTRAST TECHNIQUE: Multidetector CT imaging of the abdomen and pelvis was performed using the standard protocol following bolus administration of intravenous contrast. CONTRAST:  19mL OMNIPAQUE IOHEXOL 300 MG/ML  SOLN COMPARISON:  August 01, 2018 FINDINGS: Lower chest: There is a moderate to large right-sided pleural effusion again identified. There is a smaller left effusion, larger in the interval. Atelectasis is associated with the effusions. Cardiomegaly persists. No other abnormalities in the lower chest. Hepatobiliary: There is new gallbladder wall thickening. Cholelithiasis is identified. There is increased attenuation in the fat of the porta hepatis adjacent to the gallbladder. Hepatic steatosis. No hepatic masses. The portal vein is patent. Pancreas: Fatty deposition in the neck of the pancreas. No mass or evidence of pancreatitis. Spleen: Normal in size without focal abnormality. Adrenals/Urinary Tract: Adrenal glands are normal. No renal stones, hydronephrosis, or perinephric stranding. No renal masses are noted. No ureterectasis or ureteral stones. The bladder is normal. Stomach/Bowel: The stomach and small  bowel are normal. Scattered colonic diverticulosis is seen without diverticulitis. The appendix is not seen but there is no secondary evidence of appendicitis. Vascular/Lymphatic: Calcified atherosclerosis is seen in the nonaneurysmal aorta. No suspicious adenopathy. Reproductive: Status post hysterectomy. No adnexal masses. Other: Fluid in the pelvis is likely reactive to the gallbladder findings described above. Musculoskeletal: No acute or significant osseous findings. IMPRESSION: 1. New gallbladder wall thickening and adjacent fat stranding. Cholelithiasis. The constellation of findings is concerning for acute cholecystitis. Recommend right upper quadrant ultrasound. 2. There is a moderate to large right-sided pleural effusion again identified. The smaller left effusion is larger in the interval. 3. Scattered colonic diverticulosis without diverticulitis. 4. Fluid in the pelvis is likely reactive to the gallbladder findings. Electronically Signed   By: Gerome Sam III M.D   On: 02/18/2020 11:28   DG Chest Port 1 View  Result Date: 02/19/2020 CLINICAL DATA:  US guided thoracentesis Findings: Removed 1000 ml from right chest. EXAM: PORTABLE CHEST - 1 VIEW COMPARISON:  02/18/2020 FINDINGS: No pneumothorax. Persistent bilateral pleural effusions. Atelectasis/consolidation in the lung bases left greater than right. Perihilar interstitial opacities right greater than left as before. Heart size upper limits normal for technique. Aortic Atherosclerosis (ICD10-170.0). Visualized bones unremarkable. IMPRESSION: No pneumothorax post thoracentesis. Electronically Signed   By: Corlis Leak M.D.   On: 02/19/2020 09:39  DG Chest Portable 1 View  Result Date: 02/18/2020 CLINICAL DATA:  Chest pain.  Tachycardia. EXAM: PORTABLE CHEST 1 VIEW COMPARISON:  Jul 30, 2018. FINDINGS: Layering right greater than left pleural effusions. Hazy opacity the right lung, favored to reflect layering fluid. Additional more focal  opacities at the left lung base. Mild diffuse interstitial prominence. No visible pneumothorax on this limited portable supine radiograph. Biapical pleuroparenchymal scarring. Enlarged cardiac silhouette. Aortic atherosclerosis. No acute osseous abnormality IMPRESSION: 1. Layering right greater than left pleural effusions. Mild interstitial prominence, suggestive of mild interstitial edema. 2. Left basilar opacities may represent atelectasis, aspiration, and/or pneumonia. 3. Cardiomegaly. Electronically Signed   By: Feliberto Harts MD   On: 02/18/2020 10:15   US Abdomen Limited RUQ (LIVER/GB)  Result Date: 02/18/2020 CLINICAL DATA:  Right upper quadrant abdominal pain. EXAM: ULTRASOUND ABDOMEN LIMITED RIGHT UPPER QUADRANT COMPARISON:  CT from same day FINDINGS: Gallbladder: There is gallbladder wall thickening and pericholecystic free fluid. Multiple gallstones are noted. The sonographic Eulah Pont sign is negative. Common bile duct: Diameter: 3 mm Liver: No focal lesion identified. Within normal limits in parenchymal echogenicity. Portal vein is patent on color Doppler imaging with normal direction of blood flow towards the liver. Other: There is a right-sided pleural effusion. IMPRESSION: 1. Findings are equivocal for calculus cholecystitis. There are gallstones in the presence of gallbladder wall thickening and pericholecystic free fluid, however the sonographic Eulah Pont sign is reported as negative. Consider HIDA scan for further evaluation. 2. Large right-sided pleural effusion incidentally noted. Electronically Signed   By: Katherine Mantle M.D.   On: 02/18/2020 15:08   US THORACENTESIS ASP PLEURAL SPACE W/IMG GUIDE  Result Date: 02/19/2020 INDICATION: 86 year old with pleural effusions. EXAM: ULTRASOUND GUIDED RIGHT THORACENTESIS MEDICATIONS: None. COMPLICATIONS: None immediate. PROCEDURE: An ultrasound guided thoracentesis was thoroughly discussed with the patient and questions answered. The  benefits, risks, alternatives and complications were also discussed. The patient understands and wishes to proceed with the procedure. Written consent was obtained. Ultrasound was performed to localize and mark an adequate pocket of fluid in the right chest. The area was then prepped and draped in the normal sterile fashion. 1% Lidocaine was used for local anesthesia. Under ultrasound guidance a 6 Fr Safe-T-Centesis catheter was introduced. Thoracentesis was performed. The catheter was removed and a dressing applied. FINDINGS: A total of approximately 1 L of yellow fluid was removed. Samples were sent to the laboratory as requested by the clinical team. Negative for pneumothorax on the follow-up chest radiograph. IMPRESSION: Successful ultrasound guided right thoracentesis yielding 1 L of pleural fluid. Electronically Signed   By: Richarda Overlie M.D.   On: 02/19/2020 12:02     Assessment and Recommendation  86 y.o. female with abdominal discomfort and possible cholecystitis now improving on appropriate medication management and acute atrial fibrillation with rapid ventricular rate now with better heart rate control with appropriate medication management and no current evidence of congestive heart failure or myocardial infarction 1.  Decrease amiodarone dose to 200 mg twice per day due to concerns of significant nausea and side effects of the medication.  Lower doses may reduce nausea and tolerance of the medication 2.  Slight increase in dose of metoprolol trying to improve spontaneous conversion to normal sinus rhythm as well as heart rate between 60 and 90 bpm at rest 3.  Continuation of Eliquis 5 mg twice per day for further risk reduction in stroke with atrial fibrillation 4.  No further cardiac diagnostics necessary at this time 5.  If patient ambulating well and overall feeling well enough for discharge to home would consider discharge home with adjustments of medication management as an  outpatient  Signed, Arnoldo Hooker M.D. FACC

## 2020-02-23 NOTE — Plan of Care (Signed)
  Problem: Education: Goal: Knowledge of General Education information will improve Description Including pain rating scale, medication(s)/side effects and non-pharmacologic comfort measures Outcome: Progressing   Problem: Health Behavior/Discharge Planning: Goal: Ability to manage health-related needs will improve Outcome: Progressing   Problem: Clinical Measurements: Goal: Ability to maintain clinical measurements within normal limits will improve Outcome: Progressing Goal: Will remain free from infection Outcome: Progressing Goal: Diagnostic test results will improve Outcome: Progressing Goal: Respiratory complications will improve Outcome: Progressing Goal: Cardiovascular complication will be avoided Outcome: Progressing   Problem: Activity: Goal: Risk for activity intolerance will decrease Outcome: Progressing   Problem: Nutrition: Goal: Adequate nutrition will be maintained Outcome: Progressing   Problem: Coping: Goal: Level of anxiety will decrease Outcome: Progressing   Problem: Elimination: Goal: Will not experience complications related to bowel motility Outcome: Progressing Goal: Will not experience complications related to urinary retention Outcome: Progressing   Problem: Pain Managment: Goal: General experience of comfort will improve Outcome: Progressing   Problem: Safety: Goal: Ability to remain free from injury will improve Outcome: Progressing   Problem: Skin Integrity: Goal: Risk for impaired skin integrity will decrease Outcome: Progressing   Problem: Education: Goal: Knowledge of disease or condition will improve Outcome: Progressing Goal: Understanding of medication regimen will improve Outcome: Progressing   Problem: Activity: Goal: Ability to tolerate increased activity will improve Outcome: Progressing   Problem: Cardiac: Goal: Ability to achieve and maintain adequate cardiopulmonary perfusion will improve Outcome: Progressing    Problem: Health Behavior/Discharge Planning: Goal: Ability to safely manage health-related needs after discharge will improve Outcome: Progressing   

## 2020-02-24 LAB — BASIC METABOLIC PANEL
Anion gap: 9 (ref 5–15)
BUN: 40 mg/dL — ABNORMAL HIGH (ref 8–23)
CO2: 20 mmol/L — ABNORMAL LOW (ref 22–32)
Calcium: 8.7 mg/dL — ABNORMAL LOW (ref 8.9–10.3)
Chloride: 101 mmol/L (ref 98–111)
Creatinine, Ser: 1.19 mg/dL — ABNORMAL HIGH (ref 0.44–1.00)
GFR, Estimated: 45 mL/min — ABNORMAL LOW (ref >60)
Glucose, Bld: 193 mg/dL — ABNORMAL HIGH (ref 70–99)
Potassium: 5.1 mmol/L (ref 3.5–5.1)
Sodium: 130 mmol/L — ABNORMAL LOW (ref 135–145)

## 2020-02-24 LAB — MAGNESIUM: Magnesium: 2.3 mg/dL (ref 1.7–2.4)

## 2020-02-24 LAB — GLUCOSE, CAPILLARY: Glucose-Capillary: 235 mg/dL — ABNORMAL HIGH (ref 70–99)

## 2020-02-24 MED ORDER — AMIODARONE HCL 200 MG PO TABS: 200.0000 mg | ORAL_TABLET | Freq: Two times a day (BID) | ORAL | 0 refills | Status: DC

## 2020-02-24 MED ORDER — AMIODARONE HCL 200 MG PO TABS: 200.0000 mg | ORAL_TABLET | Freq: Two times a day (BID) | ORAL | 0 refills | Status: AC

## 2020-02-24 MED ORDER — METOPROLOL TARTRATE 75 MG PO TABS: 150.0000 mg | ORAL_TABLET | Freq: Two times a day (BID) | ORAL | 0 refills | Status: DC

## 2020-02-24 MED ORDER — CIPROFLOXACIN HCL 500 MG PO TABS: 500.0000 mg | ORAL_TABLET | Freq: Two times a day (BID) | ORAL | 0 refills | Status: AC

## 2020-02-24 MED ORDER — APIXABAN 5 MG PO TABS: 5.0000 mg | ORAL_TABLET | Freq: Two times a day (BID) | ORAL | 0 refills | Status: DC

## 2020-02-24 MED ORDER — APIXABAN 5 MG PO TABS: 5.0000 mg | ORAL_TABLET | Freq: Two times a day (BID) | ORAL | 0 refills | Status: AC

## 2020-02-24 MED ORDER — CIPROFLOXACIN HCL 500 MG PO TABS: 500.0000 mg | ORAL_TABLET | Freq: Two times a day (BID) | ORAL | 0 refills | Status: DC

## 2020-02-24 MED ORDER — METOPROLOL TARTRATE 75 MG PO TABS: 150.0000 mg | ORAL_TABLET | Freq: Two times a day (BID) | ORAL | 0 refills | Status: AC

## 2020-02-24 NOTE — Progress Notes (Signed)
[facility]  Patient: Lisa HatchetJoanne Moon / Admit Date: 02/18/2020 / Date of Encounter: 02/24/2020, 6:23 AM   Subjective: 12/23.  Patient overall feeling much better than she had on admission.  No current evidence of anginal symptoms or congestive heart failure.  The patient has had full resolution of abdominal discomfort and is on appropriate medication management for possible cholecystitis.  No further surgical intervention at this time necessary.  With this the patient did have atrial fibrillation with rapid ventricular rate with some periods of apparent bradycardia prior to admission to the hospital.  Currently telemetry shows heart rate control of 100 bpm at rest.  There has been no evidence of sick sinus syndrome and/or significant bradycardia throughout her hospitalization by telemetry.  She has been placed on appropriate medication management for possible conversion to normal rhythm including amiodarone metoprolol combination and appears to be tolerating fairly well  12/24.  Patient is heart rate control has been reasonable since yesterday.  This is with a combination of metoprolol and amiodarone.  The patient has had a rash with calcium channel blocker in the past and cannot use this combination.  The patient has had significant amount of nausea which is likely secondary to the highest dose of amiodarone and therefore may need to drop the dose of the amiodarone for better tolerance to spontaneously convert to normal rhythm.  This also may inhibit in the future of use of amiodarone although will continue to try at lower dose at this time.  Heart rate control may be improved by slightly increased dose of metoprolol.  The patient does appear to be tolerating the anticoagulation.  12/25.  No significant changes in overall condition of patient.  Patient still has some nausea but no evidence of vomiting at this time.  This may be secondary to improvements of abdominal  issues as well as adjustments of medication management including decreasing amiodarone dose.  Flagyl could also cause these symptoms.  The patient has had adjustments of medication management for heart rate control with metoprolol to a slightly higher dose with a heart rate still in the 90 to 100 bpm range.  Patient does tolerate her anticoagulation fairly well.  There is no current evidence of angina and/or congestive heart failure  Review of Systems: Positive for: Shortness of breath palpitations Negative for: Vision change, hearing change, syncope, dizziness, nausea, vomiting,diarrhea, bloody stool, stomach pain, cough, congestion, diaphoresis, urinary frequency, urinary pain,skin lesions, skin rashes Others previously listed  Objective: Telemetry: Atrial fibrillation with variable heart rate Physical Exam: Blood pressure 102/73, pulse 98, temperature 98 F (36.7 C), temperature source Oral, resp. rate 17, height 5\' 6"  (1.676 m), weight 90 kg, SpO2 98 %. Body mass index is 32.04 kg/m. General: Well developed, well nourished, in no acute distress. Head: Normocephalic, atraumatic, sclera non-icteric, no xanthomas, nares are without discharge. Neck: No apparent masses Lungs: Normal respirations with no wheezes, no rhonchi, no rales , few crackles   Heart: Irregular rate and rhythm, normal S1 S2, no murmur, no rub, no gallop, PMI is normal size and placement, carotid upstroke normal without bruit, jugular venous pressure normal Abdomen: Soft, non-tender, non-distended with normoactive bowel sounds. No hepatosplenomegaly. Abdominal aorta is normal size without bruit Extremities: No edema, no clubbing, no cyanosis, no ulcers,  Peripheral: 2+ radial, 2+ femoral, 2+ dorsal pedal pulses Neuro: Alert and oriented. Moves all extremities spontaneously. Psych:  Responds to questions appropriately with a normal affect.   Intake/Output Summary (Last 24 hours) at  02/24/2020 9826 Last data filed at  02/24/2020 0500 Gross per 24 hour  Intake 960 ml  Output 1750 ml  Net -790 ml    Inpatient Medications:  . amiodarone  200 mg Oral BID  . apixaban  5 mg Oral BID  . calcium carbonate  400 mg of elemental calcium Oral BID  . Chlorhexidine Gluconate Cloth  6 each Topical Daily  . ciprofloxacin  500 mg Oral BID  . metoprolol tartrate  150 mg Oral BID  . multivitamin with minerals  1 tablet Oral Daily  . pantoprazole  40 mg Oral Daily  . sodium chloride flush  10 mL Intravenous Q12H  . sodium chloride  1 g Oral Daily   Infusions:    Labs: Recent Labs    02/22/20 0623 02/23/20 0358  NA 131* 129*  K 4.9 5.0  CL 99 100  CO2 23 22  GLUCOSE 203* 173*  BUN 26* 36*  CREATININE 0.94 0.99  CALCIUM 8.9 8.5*  MG 2.3 2.3   Recent Labs    02/21/20 0630  AST 24  ALT 67*  ALKPHOS 96  BILITOT 1.0  PROT 6.7  ALBUMIN 3.3*   Recent Labs    02/22/20 0623 02/23/20 0358  WBC 12.3* 12.5*  NEUTROABS 10.1* 9.9*  HGB 14.2 13.9  HCT 43.1 42.0  MCV 87.6 87.1  PLT 241 223   No results for input(s): CKTOTAL, CKMB, TROPONINI in the last 72 hours. Invalid input(s): POCBNP No results for input(s): HGBA1C in the last 72 hours.   Weights: Filed Weights   02/21/20 0314 02/23/20 0350 02/24/20 0455  Weight: 90.1 kg 90.6 kg 90 kg     Radiology/Studies:  CT ABDOMEN PELVIS W CONTRAST  Result Date: 02/18/2020 CLINICAL DATA:  Diverticulitis suspected. Nausea and vomiting. Rapid heart rate. EXAM: CT ABDOMEN AND PELVIS WITH CONTRAST TECHNIQUE: Multidetector CT imaging of the abdomen and pelvis was performed using the standard protocol following bolus administration of intravenous contrast. CONTRAST:  55mL OMNIPAQUE IOHEXOL 300 MG/ML  SOLN COMPARISON:  August 01, 2018 FINDINGS: Lower chest: There is a moderate to large right-sided pleural effusion again identified. There is a smaller left effusion, larger in the interval. Atelectasis is associated with the effusions. Cardiomegaly persists. No  other abnormalities in the lower chest. Hepatobiliary: There is new gallbladder wall thickening. Cholelithiasis is identified. There is increased attenuation in the fat of the porta hepatis adjacent to the gallbladder. Hepatic steatosis. No hepatic masses. The portal vein is patent. Pancreas: Fatty deposition in the neck of the pancreas. No mass or evidence of pancreatitis. Spleen: Normal in size without focal abnormality. Adrenals/Urinary Tract: Adrenal glands are normal. No renal stones, hydronephrosis, or perinephric stranding. No renal masses are noted. No ureterectasis or ureteral stones. The bladder is normal. Stomach/Bowel: The stomach and small bowel are normal. Scattered colonic diverticulosis is seen without diverticulitis. The appendix is not seen but there is no secondary evidence of appendicitis. Vascular/Lymphatic: Calcified atherosclerosis is seen in the nonaneurysmal aorta. No suspicious adenopathy. Reproductive: Status post hysterectomy. No adnexal masses. Other: Fluid in the pelvis is likely reactive to the gallbladder findings described above. Musculoskeletal: No acute or significant osseous findings. IMPRESSION: 1. New gallbladder wall thickening and adjacent fat stranding. Cholelithiasis. The constellation of findings is concerning for acute cholecystitis. Recommend right upper quadrant ultrasound. 2. There is a moderate to large right-sided pleural effusion again identified. The smaller left effusion is larger in the interval. 3. Scattered colonic diverticulosis without diverticulitis. 4. Fluid in the  pelvis is likely reactive to the gallbladder findings. Electronically Signed   By: Gerome Sam III M.D   On: 02/18/2020 11:28   DG Chest Port 1 View  Result Date: 02/19/2020 CLINICAL DATA:  US guided thoracentesis Findings: Removed 1000 ml from right chest. EXAM: PORTABLE CHEST - 1 VIEW COMPARISON:  02/18/2020 FINDINGS: No pneumothorax. Persistent bilateral pleural effusions.  Atelectasis/consolidation in the lung bases left greater than right. Perihilar interstitial opacities right greater than left as before. Heart size upper limits normal for technique. Aortic Atherosclerosis (ICD10-170.0). Visualized bones unremarkable. IMPRESSION: No pneumothorax post thoracentesis. Electronically Signed   By: Corlis Leak M.D.   On: 02/19/2020 09:39   DG Chest Portable 1 View  Result Date: 02/18/2020 CLINICAL DATA:  Chest pain.  Tachycardia. EXAM: PORTABLE CHEST 1 VIEW COMPARISON:  Jul 30, 2018. FINDINGS: Layering right greater than left pleural effusions. Hazy opacity the right lung, favored to reflect layering fluid. Additional more focal opacities at the left lung base. Mild diffuse interstitial prominence. No visible pneumothorax on this limited portable supine radiograph. Biapical pleuroparenchymal scarring. Enlarged cardiac silhouette. Aortic atherosclerosis. No acute osseous abnormality IMPRESSION: 1. Layering right greater than left pleural effusions. Mild interstitial prominence, suggestive of mild interstitial edema. 2. Left basilar opacities may represent atelectasis, aspiration, and/or pneumonia. 3. Cardiomegaly. Electronically Signed   By: Feliberto Harts MD   On: 02/18/2020 10:15   US Abdomen Limited RUQ (LIVER/GB)  Result Date: 02/18/2020 CLINICAL DATA:  Right upper quadrant abdominal pain. EXAM: ULTRASOUND ABDOMEN LIMITED RIGHT UPPER QUADRANT COMPARISON:  CT from same day FINDINGS: Gallbladder: There is gallbladder wall thickening and pericholecystic free fluid. Multiple gallstones are noted. The sonographic Eulah Pont sign is negative. Common bile duct: Diameter: 3 mm Liver: No focal lesion identified. Within normal limits in parenchymal echogenicity. Portal vein is patent on color Doppler imaging with normal direction of blood flow towards the liver. Other: There is a right-sided pleural effusion. IMPRESSION: 1. Findings are equivocal for calculus cholecystitis. There are  gallstones in the presence of gallbladder wall thickening and pericholecystic free fluid, however the sonographic Eulah Pont sign is reported as negative. Consider HIDA scan for further evaluation. 2. Large right-sided pleural effusion incidentally noted. Electronically Signed   By: Katherine Mantle M.D.   On: 02/18/2020 15:08   US THORACENTESIS ASP PLEURAL SPACE W/IMG GUIDE  Result Date: 02/19/2020 INDICATION: 86 year old with pleural effusions. EXAM: ULTRASOUND GUIDED RIGHT THORACENTESIS MEDICATIONS: None. COMPLICATIONS: None immediate. PROCEDURE: An ultrasound guided thoracentesis was thoroughly discussed with the patient and questions answered. The benefits, risks, alternatives and complications were also discussed. The patient understands and wishes to proceed with the procedure. Written consent was obtained. Ultrasound was performed to localize and mark an adequate pocket of fluid in the right chest. The area was then prepped and draped in the normal sterile fashion. 1% Lidocaine was used for local anesthesia. Under ultrasound guidance a 6 Fr Safe-T-Centesis catheter was introduced. Thoracentesis was performed. The catheter was removed and a dressing applied. FINDINGS: A total of approximately 1 L of yellow fluid was removed. Samples were sent to the laboratory as requested by the clinical team. Negative for pneumothorax on the follow-up chest radiograph. IMPRESSION: Successful ultrasound guided right thoracentesis yielding 1 L of pleural fluid. Electronically Signed   By: Richarda Overlie M.D.   On: 02/19/2020 12:02     Assessment and Recommendation  86 y.o. female with abdominal discomfort and possible cholecystitis now improving on appropriate medication management and acute atrial fibrillation with rapid ventricular rate  now with better heart rate control with appropriate medication management and no current evidence of congestive heart failure or myocardial infarction 1.  Continuation of amiodarone  amiodarone dose to 200 mg twice per day due to concerns of significant nausea and side effects of the medication.  Lower doses may reduce nausea and tolerance of the medication for which she appears to be tolerating today 2.  Continue watching dose of metoprolol at 150 mg twice per day for better heart rate control 3.  Continuation of Eliquis 5 mg twice per day for further risk reduction in stroke with atrial fibrillation 4.  No further cardiac diagnostics necessary at this time 5.  If patient ambulating well and overall feeling well enough for discharge to home would consider discharge home with adjustments of medication management as an outpatient.  Signed, Arnoldo Hooker M.D. FACC

## 2020-02-24 NOTE — TOC Transition Note (Addendum)
Transition of Care [facility]) - CM/SW Discharge Note   Patient Details  Name: Lisa Moon MRN: [id number] Date of Birth: 1932/08/25  Transition of Care [facility] CM/SW Contact:  Luvenia Redden, RN Phone Number:[phone] 02/24/2020, 10:52 AM   Clinical Narrative:     RN notified the [facility] of choice [facility]) that pt will be discharged today for the start of services. Spoke with daughter Raynelle Fanning) with update on this notification. No other needs at this time. Attempted to call and alert [employer] of pt's discharged today for the 3-1 DME confirmation however close for the Holidays.    TOC team remains available for any additional needs.   Addendum: Pt ambulating well upon discharge and no need for [facility]. Cancelled arrangements with ([facility]) no [facility]Health services initiated. Daughter Raynelle Fanning is aware.     Patient Goals and CMS Choice        Discharge Placement                       Discharge Plan and Services                                     Social Determinants of Health (SDOH) Interventions     Readmission Risk Interventions No flowsheet data found.

## 2020-02-24 NOTE — Plan of Care (Signed)
  Problem: Education: Goal: Knowledge of General Education information will improve Description: Including pain rating scale, medication(s)/side effects and non-pharmacologic comfort measures Outcome: Adequate for Discharge   Problem: Health Behavior/Discharge Planning: Goal: Ability to manage health-related needs will improve Outcome: Adequate for Discharge   Problem: Clinical Measurements: Goal: Ability to maintain clinical measurements within normal limits will improve Outcome: Adequate for Discharge Goal: Will remain free from infection Outcome: Adequate for Discharge Goal: Diagnostic test results will improve Outcome: Adequate for Discharge Goal: Respiratory complications will improve Outcome: Adequate for Discharge Goal: Cardiovascular complication will be avoided Outcome: Adequate for Discharge   Problem: Activity: Goal: Risk for activity intolerance will decrease Outcome: Adequate for Discharge   Problem: Nutrition: Goal: Adequate nutrition will be maintained Outcome: Adequate for Discharge   Problem: Coping: Goal: Level of anxiety will decrease Outcome: Adequate for Discharge   Problem: Elimination: Goal: Will not experience complications related to bowel motility Outcome: Adequate for Discharge Goal: Will not experience complications related to urinary retention Outcome: Adequate for Discharge   Problem: Pain Managment: Goal: General experience of comfort will improve Outcome: Adequate for Discharge   Problem: Education: Goal: Knowledge of disease or condition will improve Outcome: Adequate for Discharge   Problem: Education: Goal: Knowledge of disease or condition will improve Outcome: Adequate for Discharge   Problem: Education: Goal: Knowledge of disease or condition will improve Outcome: Adequate for Discharge Goal: Understanding of medication regimen will improve Outcome: Adequate for Discharge   Problem: Activity: Goal: Ability to tolerate  increased activity will improve Outcome: Adequate for Discharge   Problem: Cardiac: Goal: Ability to achieve and maintain adequate cardiopulmonary perfusion will improve Outcome: Adequate for Discharge   Problem: Health Behavior/Discharge Planning: Goal: Ability to safely manage health-related needs after discharge will improve Outcome: Adequate for Discharge

## 2020-02-24 NOTE — Discharge Summary (Signed)
Physician Discharge Summary  Patient ID: Lisa Moon MRN: [id number] DOB/AGE: 84-Jul-1934 84 y.o.  Admit date: 02/18/2020 Discharge date: 02/24/2020  Admission Diagnoses:  Discharge Diagnoses:  Active Problems:   Acute calculous cholecystitis   Severe sepsis with septic shock (HCC)   Atrial fibrillation with RVR (HCC)   Bradycardia   HLD (hyperlipidemia)   Hypertension   Pleural effusion   Hyperkalemia   AKI (acute kidney injury) (HCC)   Elevated troponin   Hyponatremia   Discharged Condition: good  Hospital Course:  Lisa Moon a 86 y.o.femalewith medical history significant ofhypertension, hyperlipidemia, atrial fibrillation, who presents withnausea vomitingpalpitation.Upon arriving the emergency room, patient had atrial fibrillation with rapid response, received a dose of Cardizem, he developed hypotension and bradycardia. Improved after giving IV fluids. Her CT scan showed cholelithiasis with possible acute cholecystitis, right upper quadrant showed possible cholecystitis. Patient treated with antibiotics with cefepime and Flagyl. Also eval by general surgery, she is not a candidate for surgery.  #1. Severe sepsis with septic shock secondary to acute calculus cholecystitis. Acute calculus cholecystitis. Condition improved.  Patient does not have any abdominal pain or fever. General surgery has evaluated patient, believe condition has improved. Patient had a significant reaction to Flagyl and cefepime.  Antibiotic was switched to Cipro.  Due to mild nature of the infection, she only need additional 1 dose.  We will follow up with general surgery as outpatient.  2.  Nausea vomiting. Initially secondary to constipation. Better after multiple bowel movements. Mild nausea vomiting due to medication.  Flagyl discontinued.  #3.  Hyponatremia. Sodium level improved.  Patient will be on fluid restriction.  #4.  Paroxysmal atrial fibrillation with  RVR. Metoprolol was increased to 150 mg twice a day by cardiology, heart rate better controlled.  Also prescribed Eliquis.  5.  Pleural effusion. Status post paracentesis.  No evidence of infection.  6.  Essential hypertension On beta-blocker.    Consults: cardiology and general surgery  Significant Diagnostic Studies:  CT ABDOMEN AND PELVIS WITH CONTRAST  TECHNIQUE: Multidetector CT imaging of the abdomen and pelvis was performed using the standard protocol following bolus administration of intravenous contrast.  CONTRAST:  33mL OMNIPAQUE IOHEXOL 300 MG/ML  SOLN  COMPARISON:  August 01, 2018  FINDINGS: Lower chest: There is a moderate to large right-sided pleural effusion again identified. There is a smaller left effusion, larger in the interval. Atelectasis is associated with the effusions. Cardiomegaly persists. No other abnormalities in the lower chest.  Hepatobiliary: There is new gallbladder wall thickening. Cholelithiasis is identified. There is increased attenuation in the fat of the porta hepatis adjacent to the gallbladder. Hepatic steatosis. No hepatic masses. The portal vein is patent.  Pancreas: Fatty deposition in the neck of the pancreas. No mass or evidence of pancreatitis.  Spleen: Normal in size without focal abnormality.  Adrenals/Urinary Tract: Adrenal glands are normal. No renal stones, hydronephrosis, or perinephric stranding. No renal masses are noted. No ureterectasis or ureteral stones. The bladder is normal.  Stomach/Bowel: The stomach and small bowel are normal. Scattered colonic diverticulosis is seen without diverticulitis. The appendix is not seen but there is no secondary evidence of appendicitis.  Vascular/Lymphatic: Calcified atherosclerosis is seen in the nonaneurysmal aorta. No suspicious adenopathy.  Reproductive: Status post hysterectomy. No adnexal masses.  Other: Fluid in the pelvis is likely reactive to the  gallbladder findings described above.  Musculoskeletal: No acute or significant osseous findings.  IMPRESSION: 1. New gallbladder wall thickening and adjacent fat stranding. Cholelithiasis. The constellation  of findings is concerning for acute cholecystitis. Recommend right upper quadrant ultrasound. 2. There is a moderate to large right-sided pleural effusion again identified. The smaller left effusion is larger in the interval. 3. Scattered colonic diverticulosis without diverticulitis. 4. Fluid in the pelvis is likely reactive to the gallbladder findings.   Electronically Signed   By: Gerome Sam III M.D   On: 02/18/2020 11:28     Treatments: Antibiotics, thoracentesis.  Discharge Exam: Blood pressure 102/80, pulse 96, temperature (!) 97.4 F (36.3 C), temperature source Oral, resp. rate 17, height 5\' 6"  (1.676 m), weight 90 kg, SpO2 97 %. General appearance: alert and cooperative Resp: clear to auscultation bilaterally Cardio: Irregular, no murmurs. GI: soft, non-tender; bowel sounds normal; no masses,  no organomegaly Extremities: extremities normal, atraumatic, no cyanosis or edema  Disposition: Discharge disposition: 01-Home or Self Care       Discharge Instructions    Diet - low sodium heart healthy   Complete by: As directed    Fluid restriction <181ml/day   Increase activity slowly   Complete by: As directed      Allergies as of 02/24/2020      Reactions   Penicillins Rash   Sulfa Antibiotics Rash   Verapamil Rash      Medication List    STOP taking these medications   aspirin EC 81 MG tablet     TAKE these medications   amiodarone 200 MG tablet Commonly known as: PACERONE Take 1 tablet (200 mg total) by mouth 2 (two) times daily.   apixaban 5 MG Tabs tablet Commonly known as: ELIQUIS Take 1 tablet (5 mg total) by mouth 2 (two) times daily.   calcium carbonate 1250 (500 Ca) MG chewable tablet Commonly known as: OS-CAL Chew 1  tablet by mouth 2 (two) times daily.   ciprofloxacin 500 MG tablet Commonly known as: CIPRO Take 1 tablet (500 mg total) by mouth 2 (two) times daily for 1 dose.   diltiazem 30 MG tablet Commonly known as: CARDIZEM Take 30 mg by mouth 4 (four) times daily.   lovastatin 40 MG tablet Commonly known as: MEVACOR Take 40 mg by mouth at bedtime.   Metoprolol Tartrate 75 MG Tabs Take 150 mg by mouth 2 (two) times daily. What changed:   medication strength  how much to take   MULTIVITAMIN ADULT PO Take 1 tablet by mouth daily.   ondansetron 4 MG disintegrating tablet Commonly known as: ZOFRAN-ODT Take 4 mg by mouth every 8 (eight) hours as needed for nausea.            Durable Medical Equipment  (From admission, onward)         Start     Ordered   02/22/20 1431  For home use only DME 3 n 1  Once        02/22/20 1430          Follow-up Information    02/24/20 III, MD Follow up in 1 week(s).   Specialty: Internal Medicine Contact information: [address] [place] [phone]              35 minutes Signed: [phone] 02/24/2020, 10:08 AM

## 2020-03-03 ENCOUNTER — Inpatient Hospital Stay
Admission: EM | Admit: 2020-03-03 | Discharge: 2020-04-02 | DRG: 640 | Disposition: E | Payer: Medicare PPO | Attending: Pulmonary Disease | Admitting: Pulmonary Disease

## 2020-03-03 ENCOUNTER — Other Ambulatory Visit: Payer: Self-pay

## 2020-03-03 ENCOUNTER — Emergency Department: Payer: Self-pay

## 2020-03-03 ENCOUNTER — Emergency Department: Payer: Medicare PPO

## 2020-03-03 DIAGNOSIS — E8770 Fluid overload, unspecified: Secondary | ICD-10-CM | POA: Diagnosis present

## 2020-03-03 DIAGNOSIS — Z66 Do not resuscitate: Secondary | ICD-10-CM | POA: Diagnosis not present

## 2020-03-03 DIAGNOSIS — E878 Other disorders of electrolyte and fluid balance, not elsewhere classified: Secondary | ICD-10-CM | POA: Diagnosis present

## 2020-03-03 DIAGNOSIS — R7989 Other specified abnormal findings of blood chemistry: Secondary | ICD-10-CM | POA: Diagnosis present

## 2020-03-03 DIAGNOSIS — E875 Hyperkalemia: Secondary | ICD-10-CM | POA: Diagnosis present

## 2020-03-03 DIAGNOSIS — N179 Acute kidney failure, unspecified: Secondary | ICD-10-CM | POA: Diagnosis present

## 2020-03-03 DIAGNOSIS — I4891 Unspecified atrial fibrillation: Secondary | ICD-10-CM | POA: Diagnosis present

## 2020-03-03 DIAGNOSIS — Z20822 Contact with and (suspected) exposure to covid-19: Secondary | ICD-10-CM | POA: Diagnosis present

## 2020-03-03 DIAGNOSIS — Z8249 Family history of ischemic heart disease and other diseases of the circulatory system: Secondary | ICD-10-CM

## 2020-03-03 DIAGNOSIS — J9601 Acute respiratory failure with hypoxia: Principal | ICD-10-CM | POA: Diagnosis present

## 2020-03-03 DIAGNOSIS — I10 Essential (primary) hypertension: Secondary | ICD-10-CM | POA: Diagnosis present

## 2020-03-03 DIAGNOSIS — R739 Hyperglycemia, unspecified: Secondary | ICD-10-CM | POA: Diagnosis present

## 2020-03-03 DIAGNOSIS — E785 Hyperlipidemia, unspecified: Secondary | ICD-10-CM | POA: Diagnosis present

## 2020-03-03 DIAGNOSIS — E871 Hypo-osmolality and hyponatremia: Principal | ICD-10-CM | POA: Diagnosis present

## 2020-03-03 DIAGNOSIS — Z800 Family history of malignant neoplasm of digestive organs: Secondary | ICD-10-CM | POA: Diagnosis not present

## 2020-03-03 DIAGNOSIS — K8000 Calculus of gallbladder with acute cholecystitis without obstruction: Secondary | ICD-10-CM | POA: Diagnosis present

## 2020-03-03 DIAGNOSIS — I959 Hypotension, unspecified: Secondary | ICD-10-CM | POA: Diagnosis not present

## 2020-03-03 DIAGNOSIS — Z90710 Acquired absence of both cervix and uterus: Secondary | ICD-10-CM

## 2020-03-03 DIAGNOSIS — J189 Pneumonia, unspecified organism: Secondary | ICD-10-CM | POA: Insufficient documentation

## 2020-03-03 DIAGNOSIS — I469 Cardiac arrest, cause unspecified: Secondary | ICD-10-CM | POA: Diagnosis not present

## 2020-03-03 DIAGNOSIS — R945 Abnormal results of liver function studies: Secondary | ICD-10-CM | POA: Diagnosis present

## 2020-03-03 DIAGNOSIS — A419 Sepsis, unspecified organism: Secondary | ICD-10-CM | POA: Insufficient documentation

## 2020-03-03 LAB — CBC WITH DIFFERENTIAL/PLATELET
Abs Immature Granulocytes: 0.31 10*3/uL — ABNORMAL HIGH (ref 0.00–0.07)
Basophils Absolute: 0 10*3/uL (ref 0.0–0.1)
Basophils Relative: 0 %
Eosinophils Absolute: 0 10*3/uL (ref 0.0–0.5)
Eosinophils Relative: 0 %
HCT: 45.4 % (ref 36.0–46.0)
Hemoglobin: 15.6 g/dL — ABNORMAL HIGH (ref 12.0–15.0)
Immature Granulocytes: 2 %
Lymphocytes Relative: 7 %
Lymphs Abs: 1.3 10*3/uL (ref 0.7–4.0)
MCH: 29 pg (ref 26.0–34.0)
MCHC: 34.4 g/dL (ref 30.0–36.0)
MCV: 84.4 fL (ref 80.0–100.0)
Monocytes Absolute: 1 10*3/uL (ref 0.1–1.0)
Monocytes Relative: 5 %
Neutro Abs: 17.3 10*3/uL — ABNORMAL HIGH (ref 1.7–7.7)
Neutrophils Relative %: 86 %
Platelets: 222 10*3/uL (ref 150–400)
RBC: 5.38 MIL/uL — ABNORMAL HIGH (ref 3.87–5.11)
RDW: 16.8 % — ABNORMAL HIGH (ref 11.5–15.5)
WBC: 20 10*3/uL — ABNORMAL HIGH (ref 4.0–10.5)
nRBC: 0.3 % — ABNORMAL HIGH (ref 0.0–0.2)

## 2020-03-03 LAB — COMPREHENSIVE METABOLIC PANEL
ALT: 147 U/L — ABNORMAL HIGH (ref 0–44)
AST: 65 U/L — ABNORMAL HIGH (ref 15–41)
Albumin: 3.2 g/dL — ABNORMAL LOW (ref 3.5–5.0)
Alkaline Phosphatase: 391 U/L — ABNORMAL HIGH (ref 38–126)
Anion gap: 9 (ref 5–15)
BUN: 45 mg/dL — ABNORMAL HIGH (ref 8–23)
CO2: 20 mmol/L — ABNORMAL LOW (ref 22–32)
Calcium: 8.6 mg/dL — ABNORMAL LOW (ref 8.9–10.3)
Chloride: 86 mmol/L — ABNORMAL LOW (ref 98–111)
Creatinine, Ser: 1.12 mg/dL — ABNORMAL HIGH (ref 0.44–1.00)
GFR, Estimated: 48 mL/min — ABNORMAL LOW (ref >60)
Glucose, Bld: 305 mg/dL — ABNORMAL HIGH (ref 70–99)
Potassium: 6.3 mmol/L (ref 3.5–5.1)
Sodium: 115 mmol/L — CL (ref 135–145)
Total Bilirubin: 3.2 mg/dL — ABNORMAL HIGH (ref 0.3–1.2)
Total Protein: 6.4 g/dL — ABNORMAL LOW (ref 6.5–8.1)

## 2020-03-03 LAB — RESP PANEL BY RT-PCR (FLU A&B, COVID) ARPGX2
Influenza A by PCR: NEGATIVE
Influenza B by PCR: NEGATIVE
SARS Coronavirus 2 by RT PCR: NEGATIVE

## 2020-03-03 LAB — BASIC METABOLIC PANEL
Anion gap: 4 — ABNORMAL LOW (ref 5–15)
BUN: 43 mg/dL — ABNORMAL HIGH (ref 8–23)
CO2: 18 mmol/L — ABNORMAL LOW (ref 22–32)
Calcium: 7.5 mg/dL — ABNORMAL LOW (ref 8.9–10.3)
Chloride: 92 mmol/L — ABNORMAL LOW (ref 98–111)
Creatinine, Ser: 1.21 mg/dL — ABNORMAL HIGH (ref 0.44–1.00)
GFR, Estimated: 44 mL/min — ABNORMAL LOW (ref >60)
Glucose, Bld: 277 mg/dL — ABNORMAL HIGH (ref 70–99)
Potassium: 5.7 mmol/L — ABNORMAL HIGH (ref 3.5–5.1)
Sodium: 114 mmol/L — CL (ref 135–145)

## 2020-03-03 LAB — TROPONIN I (HIGH SENSITIVITY): Troponin I (High Sensitivity): 17 ng/L (ref <18)

## 2020-03-03 LAB — BRAIN NATRIURETIC PEPTIDE: B Natriuretic Peptide: 1428 pg/mL — ABNORMAL HIGH (ref 0.0–100.0)

## 2020-03-03 MED ORDER — ROCURONIUM BROMIDE 50 MG/5ML IV SOLN
20.0000 mg | Freq: Once | INTRAVENOUS | Status: AC
  Administered 2020-03-03: 20 mg via INTRAVENOUS
  Filled 2020-03-03: qty 2

## 2020-03-03 MED ORDER — FUROSEMIDE 10 MG/ML IJ SOLN
4.0000 mg/h | INTRAVENOUS | Status: DC
  Administered 2020-03-03: 4 mg/h via INTRAVENOUS
  Filled 2020-03-03: qty 20

## 2020-03-03 MED ORDER — EPINEPHRINE PF 1 MG/ML IJ SOLN
INTRAMUSCULAR | Status: AC
  Filled 2020-03-03: qty 4

## 2020-03-03 MED ORDER — VANCOMYCIN HCL IN DEXTROSE 1-5 GM/200ML-% IV SOLN
1000.0000 mg | Freq: Once | INTRAVENOUS | Status: AC
  Administered 2020-03-03: 1000 mg via INTRAVENOUS
  Filled 2020-03-03: qty 200

## 2020-03-03 MED ORDER — SODIUM CHLORIDE 0.9 % IV SOLN
500.0000 mg | Freq: Once | INTRAVENOUS | Status: AC
  Administered 2020-03-03: 500 mg via INTRAVENOUS
  Filled 2020-03-03: qty 500

## 2020-03-03 MED ORDER — ETOMIDATE 2 MG/ML IV SOLN
20.0000 mg | Freq: Once | INTRAVENOUS | Status: AC
  Administered 2020-03-03: 20 mg via INTRAVENOUS

## 2020-03-03 MED ORDER — SODIUM CHLORIDE 3 % IV SOLN
INTRAVENOUS | Status: DC
  Filled 2020-03-03 (×3): qty 500

## 2020-03-03 MED ORDER — POLYETHYLENE GLYCOL 3350 17 G PO PACK: 17.0000 g | PACK | Freq: Every day | ORAL | Status: DC | PRN

## 2020-03-03 MED ORDER — ATROPINE SULFATE 1 MG/10ML IJ SOSY
1.0000 mg | PREFILLED_SYRINGE | Freq: Once | INTRAMUSCULAR | Status: AC
  Administered 2020-03-03: 1 mg via INTRAVENOUS

## 2020-03-03 MED ORDER — NOREPINEPHRINE 4 MG/250ML-% IV SOLN
INTRAVENOUS | Status: AC
  Filled 2020-03-03: qty 250

## 2020-03-03 MED ORDER — DEXTROSE 50 % IV SOLN
INTRAVENOUS | Status: AC | PRN
  Administered 2020-03-03: 1 via INTRAVENOUS

## 2020-03-03 MED ORDER — DOCUSATE SODIUM 100 MG PO CAPS: 100.0000 mg | ORAL_CAPSULE | Freq: Two times a day (BID) | ORAL | Status: DC | PRN

## 2020-03-03 MED ORDER — SODIUM CHLORIDE 0.9% FLUSH: 3.0000 mL | Freq: Two times a day (BID) | INTRAVENOUS | Status: DC

## 2020-03-03 MED ORDER — SODIUM CHLORIDE 0.9 % IV SOLN
2.0000 g | Freq: Once | INTRAVENOUS | Status: AC
  Administered 2020-03-03: 2 g via INTRAVENOUS
  Filled 2020-03-03: qty 2

## 2020-03-03 MED ORDER — NOREPINEPHRINE 4 MG/250ML-% IV SOLN
INTRAVENOUS | Status: AC
  Administered 2020-03-03: 100 ug/min
  Administered 2020-03-03: 150 ug/min
  Filled 2020-03-03: qty 250

## 2020-03-03 MED ORDER — SODIUM CHLORIDE 0.9 % IV SOLN: 250.0000 mL | INTRAVENOUS | Status: DC | PRN

## 2020-03-03 MED ORDER — SODIUM BICARBONATE 8.4 % IV SOLN
INTRAVENOUS | Status: AC | PRN
  Administered 2020-03-03: 25 meq via INTRAVENOUS

## 2020-03-03 MED ORDER — EPINEPHRINE PF 1 MG/ML IJ SOLN
1.0000 mg | Freq: Once | INTRAMUSCULAR | Status: AC
  Administered 2020-03-03: 1 mg via INTRAVENOUS

## 2020-03-03 MED ORDER — ROCURONIUM BROMIDE 50 MG/5ML IV SOLN
20.0000 mg | Freq: Once | INTRAVENOUS | Status: AC
  Administered 2020-03-03: 20 mg via INTRAVENOUS

## 2020-03-03 MED ORDER — ENOXAPARIN SODIUM 60 MG/0.6ML ~~LOC~~ SOLN: 0.5000 mg/kg | SUBCUTANEOUS | Status: DC

## 2020-03-03 MED ORDER — PATIROMER SORBITEX CALCIUM 8.4 G PO PACK
16.8000 g | PACK | Freq: Every day | ORAL | Status: DC
  Administered 2020-03-03: 16.8 g via ORAL
  Filled 2020-03-03 (×2): qty 2

## 2020-03-03 MED ORDER — EPINEPHRINE 1 MG/10ML IJ SOSY
PREFILLED_SYRINGE | INTRAMUSCULAR | Status: AC | PRN
  Administered 2020-03-03 (×3): 1 mg via INTRAVENOUS

## 2020-03-03 MED ORDER — SODIUM CHLORIDE 0.9 % IV BOLUS
1000.0000 mL | Freq: Once | INTRAVENOUS | Status: AC
  Administered 2020-03-03: 1000 mL via INTRAVENOUS

## 2020-03-03 MED ORDER — SODIUM CHLORIDE 0.9% FLUSH: 3.0000 mL | INTRAVENOUS | Status: DC | PRN

## 2020-03-03 MED ORDER — INSULIN ASPART 100 UNIT/ML ~~LOC~~ SOLN
6.0000 [IU] | Freq: Once | SUBCUTANEOUS | Status: AC
  Administered 2020-03-03: 6 [IU] via INTRAVENOUS
  Filled 2020-03-03: qty 1

## 2020-03-03 MED ORDER — INSULIN ASPART 100 UNIT/ML ~~LOC~~ SOLN
SUBCUTANEOUS | Status: AC
  Filled 2020-03-03: qty 1

## 2020-03-03 MED ORDER — CALCIUM CHLORIDE 10 % IV SOLN
INTRAVENOUS | Status: AC | PRN
  Administered 2020-03-03: 1 g via INTRAVENOUS

## 2020-03-05 MED FILL — Medication: Qty: 1 | Status: AC

## 2020-03-08 LAB — CULTURE, BLOOD (ROUTINE X 2)
Culture: NO GROWTH
Culture: NO GROWTH

## 2020-04-02 NOTE — ED Notes (Signed)
Dr. Gonzalez at bedside at this time.  

## 2020-04-02 NOTE — ED Notes (Signed)
Pt prepped for RSI at this time per Marcos Eke, MD.

## 2020-04-02 NOTE — ED Provider Notes (Signed)
.  Central Line  Date/Time: 2020-03-17 6:48 PM Performed by: Gilles Chiquito, MD Authorized by: Gilles Chiquito, MD   Consent:    Consent obtained:  Emergent situation   Consent given by:  Patient   Risks discussed:  Incorrect placement, arterial puncture, bleeding, infection and nerve damage Universal protocol:    Patient identity confirmed:  Verbally with patient Pre-procedure details:    Indication(s): central venous access     Hand hygiene: Hand hygiene performed prior to insertion     Skin preparation:  Povidone-iodine Procedure details:    Location:  R femoral   Procedural supplies:  Triple lumen   Number of attempts:  1   Successful placement: yes   Post-procedure details:    Post-procedure:  Dressing applied and line sutured   Assessment:  Blood return through all ports   Procedure completion:  Tolerated      Gilles Chiquito, MD 03-17-2020 1849

## 2020-04-02 NOTE — ED Notes (Addendum)
Pt shocked at this time per Marcos Eke, MD at 200J. CPR continued after shock delivered.

## 2020-04-02 NOTE — Progress Notes (Addendum)
PHARMACY -  BRIEF ANTIBIOTIC NOTE   Pharmacy has received consult(s) for cefepime and vancomycin from an ED provider.  The patient's profile has been reviewed for ht/wt/allergies/indication/available labs.    One time order(s) placed for cefepime 2g IV and vancomycin 1g IV  If vancomycin is continued, consider additional 1g for total loading dose of 2g  Further antibiotics/pharmacy consults should be ordered by admitting physician if indicated.                       Raiford Noble, PharmD Pharmacy Resident  Mar 31, 2020 2:56 PM

## 2020-04-02 NOTE — ED Notes (Signed)
Zoll pads placed on patient at this time. Pt's BP continues to be > 70, HR < 110 and irregular. EDP attempted central line placement at this time.

## 2020-04-02 NOTE — H&P (Addendum)
Lisa Moon is an 87 y.o. female.   Chief Complaint: Generalized weakness and shortness of breath for several days HPI:  Patient is an 87 year old female with a history as noted below who presented with worsening shortness of breath over the last several days as well as generalized malaise.  She also feels like she is "swollen all over" and having worsening of baseline orthopnea.  She also states that she is very weak.  She states that weakness has gotten worse over the last week she actually had placed a call to her cardiologist with regards to this on 27 December.  She has not had any fevers, chills or sweats.  She recently had a prolonged hospitalization due to what was suspected to be cholecystitis and potential pneumonia.  During her hospitalization she developed cardiac arrhythmias and was placed on amiodarone.  She has been nauseated.  She has not had any vomiting.  She appears acutely ill and does not endorse any other complaint.  Daughter present and corroborates history.  On presentation to the ED she was noted to have decreased oxygen saturations.  Of note review of the patient's most recent records show that she had been exhibiting hyponatremia and relative hyperkalemia.  Past Medical History:  Diagnosis Date  . A-fib ([place])   . HLD (hyperlipidemia)   . Hypertension     Past Surgical History:  Procedure Laterality Date  . ABDOMINAL HYSTERECTOMY    . CARDIAC ELECTROPHYSIOLOGY MAPPING AND ABLATION     Patient Active Problem List   Diagnosis Date Noted  . HTN (hypertension) 2020-03-29  . Abnormal LFTs 2020/03/29  . Sepsis ([place]) 2020/03/29  . Acute respiratory failure with hypoxia ([place]) March 29, 2020  . HCAP (healthcare-associated pneumonia) 2020-03-29  . Acute calculous cholecystitis 02/18/2020  . Severe sepsis with septic shock ([place]) 02/18/2020  . Atrial fibrillation with RVR ([place]) 02/18/2020  . Bradycardia 02/18/2020  . Hyponatremia 02/18/2020  . HLD (hyperlipidemia)   .  Hypertension   . Pleural effusion   . Hyperkalemia   . AKI (acute kidney injury) ([place])   . Elevated troponin   . Bilateral pleural effusion   . CAP (community acquired pneumonia) 07/30/2018     Family History  Problem Relation Age of Onset  . Liver cancer Mother   . Heart attack Mother   . CAD Father    Social History:  reports that she has never smoked. She has never used smokeless tobacco. No history on file for alcohol use and drug use.  Allergies:  Allergies  Allergen Reactions  . Flagyl [Metronidazole] Nausea And Vomiting  . Penicillins Rash  . Sulfa Antibiotics Rash  . Verapamil Rash   Current Outpatient Medications  Medication Instructions  . amiodarone (PACERONE) 200 mg, Oral, 2 times daily  . apixaban (ELIQUIS) 5 mg, Oral, 2 times daily  . calcium carbonate (OS-CAL) 1250 (500 Ca) MG chewable tablet 1 tablet, Oral, 2 times daily  . clotrimazole (MYCELEX) 10 mg, Oral, 5 times daily  . lovastatin (MEVACOR) 40 mg, Oral, Daily at bedtime  . Metoprolol Tartrate 150 mg, Oral, 2 times daily  . Multiple Vitamins-Minerals (MULTIVITAMIN ADULT PO) 1 tablet, Oral, Daily  . ondansetron (ZOFRAN-ODT) 4 mg, Oral, Every 8 hours PRN   Amiodarone was started in December.   Results for orders placed or performed during the hospital encounter of March 29, 2020 (from the past 48 hour(s))  Basic metabolic panel     Status: Abnormal   Collection Time: 2020/03/29  1:41 PM  Result Value Ref Range  Sodium 114 (LL) 135 - 145 mmol/L    Comment: CRITICAL RESULT CALLED TO, READ BACK BY AND VERIFIED WITH CANDICE KIO AT 1606 ON 02-Apr-2020 BY SS    Potassium 5.7 (H) 3.5 - 5.1 mmol/L   Chloride 92 (L) 98 - 111 mmol/L   CO2 18 (L) 22 - 32 mmol/L   Glucose, Bld 277 (H) 70 - 99 mg/dL    Comment: Glucose reference range applies only to samples taken after fasting for at least 8 hours.   BUN 43 (H) 8 - 23 mg/dL   Creatinine, Ser 2.77 (H) 0.44 - 1.00 mg/dL   Calcium 7.5 (L) 8.9 - 10.3 mg/dL   GFR,  Estimated 44 (L) >60 mL/min    Comment: (NOTE) Calculated using the CKD-EPI Creatinine Equation (2021)    Anion gap 4 (L) 5 - 15    Comment: Performed at [facility], [address] [place], [place] [address]  CBC with Differential     Status: Abnormal   Collection Time: 04-02-2020  2:05 PM  Result Value Ref Range   WBC 20.0 (H) 4.0 - 10.5 K/uL   RBC 5.38 (H) 3.87 - 5.11 MIL/uL   Hemoglobin 15.6 (H) 12.0 - 15.0 g/dL   HCT 53.6 14.4 - 31.5 %   MCV 84.4 80.0 - 100.0 fL   MCH 29.0 26.0 - 34.0 pg   MCHC 34.4 30.0 - 36.0 g/dL   RDW 40.0 (H) 86.7 - 61.9 %   Platelets 222 150 - 400 K/uL   nRBC 0.3 (H) 0.0 - 0.2 %   Neutrophils Relative % 86 %   Neutro Abs 17.3 (H) 1.7 - 7.7 K/uL   Lymphocytes Relative 7 %   Lymphs Abs 1.3 0.7 - 4.0 K/uL   Monocytes Relative 5 %   Monocytes Absolute 1.0 0.1 - 1.0 K/uL   Eosinophils Relative 0 %   Eosinophils Absolute 0.0 0.0 - 0.5 K/uL   Basophils Relative 0 %   Basophils Absolute 0.0 0.0 - 0.1 K/uL   Immature Granulocytes 2 %   Abs Immature Granulocytes 0.31 (H) 0.00 - 0.07 K/uL    Comment: Performed at [facility], [address] [place], [place] [address]  Comprehensive metabolic panel     Status: Abnormal   Collection Time: 04/02/2020  2:05 PM  Result Value Ref Range   Sodium 115 (LL) 135 - 145 mmol/L    Comment: CRITICAL RESULT CALLED TO, READ BACK BY AND VERIFIED WITH CANDICE KIO AT 1504 ON 2020-04-02 BY SS    Potassium 6.3 (HH) 3.5 - 5.1 mmol/L    Comment: CRITICAL RESULT CALLED TO, READ BACK BY AND VERIFIED WITH CANDICE KIO AT 1504 ON 04/02/2020 BY SS    Chloride 86 (L) 98 - 111 mmol/L   CO2 20 (L) 22 - 32 mmol/L   Glucose, Bld 305 (H) 70 - 99 mg/dL    Comment: Glucose reference range applies only to samples taken after fasting for at least 8 hours.   BUN 45 (H) 8 - 23 mg/dL   Creatinine, Ser 6.71 (H) 0.44 - 1.00 mg/dL   Calcium 8.6 (L) 8.9 - 10.3 mg/dL   Total Protein 6.4 (L) 6.5 - 8.1 g/dL   Albumin 3.2 (L) 3.5 -  5.0 g/dL   AST 65 (H) 15 - 41 U/L   ALT 147 (H) 0 - 44 U/L   Alkaline Phosphatase 391 (H) 38 - 126 U/L   Total Bilirubin 3.2 (H) 0.3 - 1.2 mg/dL  GFR, Estimated 48 (L) >60 mL/min    Comment: (NOTE) Calculated using the CKD-EPI Creatinine Equation (2021)    Anion gap 9 5 - 15    Comment: Performed at [facility], [address] [place], [place] [address]  Brain natriuretic peptide     Status: Abnormal   Collection Time: 04/01/2020  2:05 PM  Result Value Ref Range   B Natriuretic Peptide 1,428.0 (H) 0.0 - 100.0 pg/mL    Comment: Performed at [facility], [address] [place], [place] [address]  Troponin I (High Sensitivity)     Status: None   Collection Time: 04-01-2020  2:05 PM  Result Value Ref Range   Troponin I (High Sensitivity) 17 <18 ng/L    Comment: (NOTE) Elevated high sensitivity troponin I (hsTnI) values and significant  changes across serial measurements may suggest ACS but many other  chronic and acute conditions are known to elevate hsTnI results.  Refer to the "Links" section for chest pain algorithms and additional  guidance. Performed at [facility], [address] [place], [place] [address]   Resp Panel by RT-PCR (Flu A&B, Covid) Nasopharyngeal Swab     Status: None   Collection Time: Apr 01, 2020  2:05 PM   Specimen: Nasopharyngeal Swab; Nasopharyngeal(NP) swabs in vial transport medium  Result Value Ref Range   SARS Coronavirus 2 by RT PCR NEGATIVE NEGATIVE    Comment: (NOTE) SARS-CoV-2 target nucleic acids are NOT DETECTED.  The SARS-CoV-2 RNA is generally detectable in upper respiratory specimens during the acute phase of infection. The lowest concentration of SARS-CoV-2 viral copies this assay can detect is 138 copies/mL. A negative result does not preclude SARS-Cov-2 infection and should not be used as the sole basis for treatment or other patient management decisions. A negative result may occur with  improper specimen  collection/handling, submission of specimen other than nasopharyngeal swab, presence of viral mutation(s) within the areas targeted by this assay, and inadequate number of viral copies(<138 copies/mL). A negative result must be combined with clinical observations, patient history, and epidemiological information. The expected result is Negative.  Fact Sheet for Patients:  BloggerCourse.com  Fact Sheet for Healthcare Providers:  SeriousBroker.it  This test is no t yet approved or cleared by the [place] FDA and  has been authorized for detection and/or diagnosis of SARS-CoV-2 by FDA under an Emergency Use Authorization (EUA). This EUA will remain  in effect (meaning this test can be used) for the duration of the COVID-19 declaration under Section 564(b)(1) of the Act, 21 U.S.C.section 360bbb-3(b)(1), unless the authorization is terminated  or revoked sooner.       Influenza A by PCR NEGATIVE NEGATIVE   Influenza B by PCR NEGATIVE NEGATIVE    Comment: (NOTE) The Xpert Xpress SARS-CoV-2/FLU/RSV plus assay is intended as an aid in the diagnosis of influenza from Nasopharyngeal swab specimens and should not be used as a sole basis for treatment. Nasal washings and aspirates are unacceptable for Xpert Xpress SARS-CoV-2/FLU/RSV testing.  Fact Sheet for Patients: BloggerCourse.com  Fact Sheet for Healthcare Providers: SeriousBroker.it  This test is not yet approved or cleared by the [place] FDA and has been authorized for detection and/or diagnosis of SARS-CoV-2 by FDA under an Emergency Use Authorization (EUA). This EUA will remain in effect (meaning this test can be used) for the duration of the COVID-19 declaration under Section 564(b)(1) of the Act, 21 U.S.C. section 360bbb-3(b)(1), unless the authorization is terminated or revoked.  Performed at [facility], [address] [place], [place] [address]    DG Chest Portable 1 View  Result Date: 03-07-20 CLINICAL DATA:  Shortness of breath EXAM: PORTABLE CHEST 1 VIEW COMPARISON:  02/19/2020 FINDINGS: Cardiomegaly, vascular congestion. Right lower lobe consolidation concerning for pneumonia. Small bilateral pleural effusions. Left base atelectasis. Biapical scarring, stable. No acute bony abnormality. IMPRESSION: Focal opacity in the right lung base concerning for pneumonia. Small bilateral effusions.  Left base atelectasis. Cardiomegaly, vascular congestion. Electronically Signed   By: Charlett Nose M.D.   On: March 07, 2020 14:36   [place] EKG SITE RITE  Result Date: 03-07-2020 If Site Rite image not attached, placement could not be confirmed due to current cardiac rhythm.  Review of Systems A 10 point review of systems was performed and it is as noted above otherwise negative. Blood pressure 90/65, pulse 100, temperature 98 F (36.7 C), temperature source Rectal, resp. rate (!) 26, height 5\' 6"  (1.676 m), weight 104.3 kg, SpO2 93 %.   Physical Exam  GENERAL: Pale and acutely ill-appearing elderly woman in no respiratory distress comfortable nasal cannula O2. HEAD: Normocephalic, atraumatic.  EYES: Pupils equal, round, reactive to light.  No scleral icterus.  Conjunctiva somewhat pale MOUTH: Oral mucosa moist.  No thrush. NECK: Supple. No thyromegaly. Trachea midline. No JVD.  No adenopathy.  No stridor. PULMONARY: Good air entry bilaterally.  Somewhat tachypneic.  No use of accessories.  Inspiratory rales at bases. CARDIOVASCULAR: S1 and S2. Regular rate and rhythm.  Rate 100.  No murmurs appreciated. ABDOMEN: Obese, taut, normoactive bowel sounds, no rebound or tenderness. MUSCULOSKELETAL: No joint deformity, no clubbing, she has 2-3+ anasarca. NEUROLOGIC: No overt focal deficit, generalized weakness. SKIN: Intact, cool distally.  No rashes.  No petechiae PSYCH: Flat  affect   Assessment/Plan  Acute respiratory failure with hypoxia Due to volume overload Anasarca Elevated BNP 2D echo Lasix as tolerates Close monitoring vis--vis hyponatremia Oxygen supplementation to maintain O2 sats of 92% or better  Weakness due to severe electrolyte abnormalities Severe hyponatremia/hyperkalemia Anasarca Acute kidney injury Admit to stepdown Cardiac monitoring vis--vis electrolyte abnormalities Appears to have hypervolemic hyponatremia BNP elevated query CHF Discussed with nephrology, Lasix drip Obtain urine sodium, chloride and creatinine Check serum and urine osmolality Check cortisol TSH normal Monitor sodium carefully, avoid rapid shift of sodium Query amiodarone induced hyponatremia  Atrial fibrillation Currently in sinus Has been on amiodarone, hold  History of recent cholecystitis Procalcitonin negative No abdominal rebound or tenderness SEPSIS ruled out Deemed not a surgical candidate by surgery previously due to multiple comorbidities   Of care discussion: Will address reversible issues.  Patient at this time would want to be FULL CODE.  Previously had been DNR.  Best practice protocols per SDU/ICU admission order set, please see orders for details.   , MD [place] [facility] 2020/03/07, 8:15 PM  *This note was dictated using voice recognition software/Dragon.  Despite best efforts to proofread, errors can occur which can change the meaning.  Any change was purely unintentional.

## 2020-04-02 NOTE — Death Summary Note (Signed)
DEATH SUMMARY   Patient Details  Name: Lisa Moon MRN: [id number] DOB: 08-27-1932  Admission/Discharge Information   Admit Date:  March 14, 2020  Date of Death: Date of Death: 03-14-20  Time of Death: Time of Death: 1921  Length of Stay: 0  Referring Physician: Lynnea Ferrier, MD   Reason(s) for Hospitalization  Generalized weakness  Diagnoses  Preliminary cause of death:   Cardiac arrest due to arrhythmia from electrolyte abnormalities Secondary Diagnoses (including complications and co-morbidities):  Active Problems: Severe hyponatremia Hyperkalemia AKI (acute kidney injury) (HCC) Acute respiratory failure with hypoxia (HCC) Sepsis ruled out  Brief Hospital Course (including significant findings, care, treatment, and services provided and events leading to death)  Lisa Moon is a 87 y.o. year old female  with a history of recent cholecystitis and A. fib with RVR who presented with worsening shortness of breath over the last several days as well as generalized malaise.  She also feels like she is "swollen all over" and having worsening of baseline orthopnea.  She also states that she is very weak.  She states that weakness has gotten worse over the last week she actually had placed a call to her cardiologist with regards to this on 27 December.  She has not had any fevers, chills or sweats.  She recently had a prolonged hospitalization due to what was suspected to be cholecystitis and potential pneumonia.  During her hospitalization she developed cardiac arrhythmias and was placed on amiodarone.  She has been nauseated.  She has not had any vomiting.  She appears acutely ill and does not endorse any other complaint.  Daughter present and corroborates history.  On presentation to the ED she was noted to have decreased oxygen saturations.  The patient was admitted to the stepdown unit and was awaiting bed placement when she developed issues with sudden hypotension.  Central line was  placed by the emergency room physician and on my arrival at the bedside and was noted to be on close to 200 mcg of Levophed to maintain blood pressure.  Cardiac rhythm appears to be somewhat bizarre more consistent with a metabolic picture.  In the process of stabilizing the patient it was noted that she had worsening of mental status and oxygen saturations could not be obtained due to low perfusion.  It was determined patient would require intubation for support.  Prior to ET tube being inserted patient was noted to lose pulse.  CPR was initiated quickly after ET tube was finally inserted.  This was inserted on first attempt.  ACLS protocol was followed, the patient continued to be refractory requiring CPR.  She received 1 amp of D50, insulin 10 units, calcium 1 amp and 1 amp of bicarb.  Patient also received several amps of epinephrine.  Please see CODE BLUE documentation.  Patient subsequently had return of spontaneous circulation however still requiring high dose of pressors.  A call was made to the patient's daughter who at this point made the patient DNR in the event of recurrent arrest.  Patient was being prepared for transport to the ICU when she became asystolic.  At this point she was pronounced dead at 1921 hrs.  Of note laboratory data on presentation did not support infectious process at that time.  Sepsis was ruled out.  Leukocytosis was likely due to stress.  Procalcitonin was less than 0.10.   Pertinent Labs and Studies  Significant Diagnostic Studies CT ABDOMEN PELVIS W CONTRAST  Result Date: 02/18/2020 CLINICAL DATA:  Diverticulitis  suspected. Nausea and vomiting. Rapid heart rate. EXAM: CT ABDOMEN AND PELVIS WITH CONTRAST TECHNIQUE: Multidetector CT imaging of the abdomen and pelvis was performed using the standard protocol following bolus administration of intravenous contrast. CONTRAST:  61mL OMNIPAQUE IOHEXOL 300 MG/ML  SOLN COMPARISON:  August 01, 2018 FINDINGS: Lower chest: There is a  moderate to large right-sided pleural effusion again identified. There is a smaller left effusion, larger in the interval. Atelectasis is associated with the effusions. Cardiomegaly persists. No other abnormalities in the lower chest. Hepatobiliary: There is new gallbladder wall thickening. Cholelithiasis is identified. There is increased attenuation in the fat of the porta hepatis adjacent to the gallbladder. Hepatic steatosis. No hepatic masses. The portal vein is patent. Pancreas: Fatty deposition in the neck of the pancreas. No mass or evidence of pancreatitis. Spleen: Normal in size without focal abnormality. Adrenals/Urinary Tract: Adrenal glands are normal. No renal stones, hydronephrosis, or perinephric stranding. No renal masses are noted. No ureterectasis or ureteral stones. The bladder is normal. Stomach/Bowel: The stomach and small bowel are normal. Scattered colonic diverticulosis is seen without diverticulitis. The appendix is not seen but there is no secondary evidence of appendicitis. Vascular/Lymphatic: Calcified atherosclerosis is seen in the nonaneurysmal aorta. No suspicious adenopathy. Reproductive: Status post hysterectomy. No adnexal masses. Other: Fluid in the pelvis is likely reactive to the gallbladder findings described above. Musculoskeletal: No acute or significant osseous findings. IMPRESSION: 1. New gallbladder wall thickening and adjacent fat stranding. Cholelithiasis. The constellation of findings is concerning for acute cholecystitis. Recommend right upper quadrant ultrasound. 2. There is a moderate to large right-sided pleural effusion again identified. The smaller left effusion is larger in the interval. 3. Scattered colonic diverticulosis without diverticulitis. 4. Fluid in the pelvis is likely reactive to the gallbladder findings. Electronically Signed   By: Gerome Sam III M.D   On: 02/18/2020 11:28   DG Chest Portable 1 View  Result Date: 2020-03-10 CLINICAL DATA:   Shortness of breath EXAM: PORTABLE CHEST 1 VIEW COMPARISON:  02/19/2020 FINDINGS: Cardiomegaly, vascular congestion. Right lower lobe consolidation concerning for pneumonia. Small bilateral pleural effusions. Left base atelectasis. Biapical scarring, stable. No acute bony abnormality. IMPRESSION: Focal opacity in the right lung base concerning for pneumonia. Small bilateral effusions.  Left base atelectasis. Cardiomegaly, vascular congestion. Electronically Signed   By: Charlett Nose M.D.   On: 03/10/2020 14:36   DG Chest Port 1 View  Result Date: 02/19/2020 CLINICAL DATA:  US guided thoracentesis Findings: Removed 1000 ml from right chest. EXAM: PORTABLE CHEST - 1 VIEW COMPARISON:  02/18/2020 FINDINGS: No pneumothorax. Persistent bilateral pleural effusions. Atelectasis/consolidation in the lung bases left greater than right. Perihilar interstitial opacities right greater than left as before. Heart size upper limits normal for technique. Aortic Atherosclerosis (ICD10-170.0). Visualized bones unremarkable. IMPRESSION: No pneumothorax post thoracentesis. Electronically Signed   By: Corlis Leak M.D.   On: 02/19/2020 09:39   DG Chest Portable 1 View  Result Date: 02/18/2020 CLINICAL DATA:  Chest pain.  Tachycardia. EXAM: PORTABLE CHEST 1 VIEW COMPARISON:  Jul 30, 2018. FINDINGS: Layering right greater than left pleural effusions. Hazy opacity the right lung, favored to reflect layering fluid. Additional more focal opacities at the left lung base. Mild diffuse interstitial prominence. No visible pneumothorax on this limited portable supine radiograph. Biapical pleuroparenchymal scarring. Enlarged cardiac silhouette. Aortic atherosclerosis. No acute osseous abnormality IMPRESSION: 1. Layering right greater than left pleural effusions. Mild interstitial prominence, suggestive of mild interstitial edema. 2. Left basilar opacities may represent atelectasis,  aspiration, and/or pneumonia. 3. Cardiomegaly.  Electronically Signed   By: Feliberto Harts MD   On: 02/18/2020 10:15   [place] EKG SITE RITE  Result Date: 03-09-2020 If Site Rite image not attached, placement could not be confirmed due to current cardiac rhythm.  US Abdomen Limited RUQ (LIVER/GB)  Result Date: 02/18/2020 CLINICAL DATA:  Right upper quadrant abdominal pain. EXAM: ULTRASOUND ABDOMEN LIMITED RIGHT UPPER QUADRANT COMPARISON:  CT from same day FINDINGS: Gallbladder: There is gallbladder wall thickening and pericholecystic free fluid. Multiple gallstones are noted. The sonographic Eulah Pont sign is negative. Common bile duct: Diameter: 3 mm Liver: No focal lesion identified. Within normal limits in parenchymal echogenicity. Portal vein is patent on color Doppler imaging with normal direction of blood flow towards the liver. Other: There is a right-sided pleural effusion. IMPRESSION: 1. Findings are equivocal for calculus cholecystitis. There are gallstones in the presence of gallbladder wall thickening and pericholecystic free fluid, however the sonographic Eulah Pont sign is reported as negative. Consider HIDA scan for further evaluation. 2. Large right-sided pleural effusion incidentally noted. Electronically Signed   By: Katherine Mantle M.D.   On: 02/18/2020 15:08   US THORACENTESIS ASP PLEURAL SPACE W/IMG GUIDE  Result Date: 02/19/2020 INDICATION: 87 year old with pleural effusions. EXAM: ULTRASOUND GUIDED RIGHT THORACENTESIS MEDICATIONS: None. COMPLICATIONS: None immediate. PROCEDURE: An ultrasound guided thoracentesis was thoroughly discussed with the patient and questions answered. The benefits, risks, alternatives and complications were also discussed. The patient understands and wishes to proceed with the procedure. Written consent was obtained. Ultrasound was performed to localize and mark an adequate pocket of fluid in the right chest. The area was then prepped and draped in the normal sterile fashion. 1% Lidocaine was used for local  anesthesia. Under ultrasound guidance a 6 Fr Safe-T-Centesis catheter was introduced. Thoracentesis was performed. The catheter was removed and a dressing applied. FINDINGS: A total of approximately 1 L of yellow fluid was removed. Samples were sent to the laboratory as requested by the clinical team. Negative for pneumothorax on the follow-up chest radiograph. IMPRESSION: Successful ultrasound guided right thoracentesis yielding 1 L of pleural fluid. Electronically Signed   By: Richarda Overlie M.D.   On: 02/19/2020 12:02    Microbiology Recent Results (from the past 240 hour(s))  Resp Panel by RT-PCR (Flu A&B, Covid) Nasopharyngeal Swab     Status: None   Collection Time: 03/09/2020  2:05 PM   Specimen: Nasopharyngeal Swab; Nasopharyngeal(NP) swabs in vial transport medium  Result Value Ref Range Status   SARS Coronavirus 2 by RT PCR NEGATIVE NEGATIVE Final    Comment: (NOTE) SARS-CoV-2 target nucleic acids are NOT DETECTED.  The SARS-CoV-2 RNA is generally detectable in upper respiratory specimens during the acute phase of infection. The lowest concentration of SARS-CoV-2 viral copies this assay can detect is 138 copies/mL. A negative result does not preclude SARS-Cov-2 infection and should not be used as the sole basis for treatment or other patient management decisions. A negative result may occur with  improper specimen collection/handling, submission of specimen other than nasopharyngeal swab, presence of viral mutation(s) within the areas targeted by this assay, and inadequate number of viral copies(<138 copies/mL). A negative result must be combined with clinical observations, patient history, and epidemiological information. The expected result is Negative.  Fact Sheet for Patients:  BloggerCourse.com  Fact Sheet for Healthcare Providers:  SeriousBroker.it  This test is no t yet approved or cleared by the [place] FDA and  has  been authorized for detection and/or  diagnosis of SARS-CoV-2 by FDA under an Emergency Use Authorization (EUA). This EUA will remain  in effect (meaning this test can be used) for the duration of the COVID-19 declaration under Section 564(b)(1) of the Act, 21 U.S.C.section 360bbb-3(b)(1), unless the authorization is terminated  or revoked sooner.       Influenza A by PCR NEGATIVE NEGATIVE Final   Influenza B by PCR NEGATIVE NEGATIVE Final    Comment: (NOTE) The Xpert Xpress SARS-CoV-2/FLU/RSV plus assay is intended as an aid in the diagnosis of influenza from Nasopharyngeal swab specimens and should not be used as a sole basis for treatment. Nasal washings and aspirates are unacceptable for Xpert Xpress SARS-CoV-2/FLU/RSV testing.  Fact Sheet for Patients: EntrepreneurPulse.com.au  Fact Sheet for Healthcare Providers: IncredibleEmployment.be  This test is not yet approved or cleared by the [place] FDA and has been authorized for detection and/or diagnosis of SARS-CoV-2 by FDA under an Emergency Use Authorization (EUA). This EUA will remain in effect (meaning this test can be used) for the duration of the COVID-19 declaration under Section 564(b)(1) of the Act, 21 U.S.C. section 360bbb-3(b)(1), unless the authorization is terminated or revoked.  Performed at [facility], [address] [place], [place] [address]     Lab Basic Metabolic Panel: Recent Labs  Lab Mar 04, 2020 1341 2020/03/04 1405  NA 114* 115*  K 5.7* 6.3*  CL 92* 86*  CO2 18* 20*  GLUCOSE 277* 305*  BUN 43* 45*  CREATININE 1.21* 1.12*  CALCIUM 7.5* 8.6*   Liver Function Tests: Recent Labs  Lab 03-04-20 1405  AST 65*  ALT 147*  ALKPHOS 391*  BILITOT 3.2*  PROT 6.4*  ALBUMIN 3.2*   No results for input(s): LIPASE, AMYLASE in the last 168 hours. No results for input(s): AMMONIA in the last 168 hours. CBC: Recent Labs  Lab 2020-03-04 1405  WBC  20.0*  NEUTROABS 17.3*  HGB 15.6*  HCT 45.4  MCV 84.4  PLT 222   Cardiac Enzymes: No results for input(s): CKTOTAL, CKMB, CKMBINDEX, TROPONINI in the last 168 hours. Sepsis Labs: Recent Labs  Lab March 04, 2020 1405  WBC 20.0*    Procedures/Operations  Central line 04-Mar-2020 Intubation 2020-03-04 CPR 03-04-2020   C. Derrill Kay, MD [facility] 03/04/2020, 8:42 PM

## 2020-04-02 NOTE — ED Notes (Signed)
EDP at bedside to place R femoral triple lumen central line. Admitting MD messaged regarding change in patient status.

## 2020-04-02 NOTE — Progress Notes (Signed)
PICC order received.  Spoke with [employer] RN re pt status.  States pt is critically ill and needs better access due to swelling.  Last VS noted to be BP 69/55 with HR 30, Candice RN confirms that is a correct BP but inaccurate HR.  Notified that unable to place PICC with that BP.  Also noted that blood culture x 2 were drawn at 1405 today.  Recommend waiting until blood cx negative x 48 hours to place PICC and possibly place CVC until then.  Candice RN to notify MD.

## 2020-04-02 NOTE — ED Notes (Signed)
Date and time results received: March 14, 2020 1503   Test: Potassium Critical Value: 6.3  Name of Provider Notified: Roxan Hockey, MD   Orders Received? Or Actions Taken?: ED MD Aware

## 2020-04-02 NOTE — ED Notes (Signed)
ED provider Katrinka Blazing at bedside. Assisting with care at this time until critical provider can come to bedside. Will place CVC line at this time.

## 2020-04-02 NOTE — ED Provider Notes (Signed)
[facility] Emergency Department Provider Note    Event Date/Time   First MD Initiated Contact with Patient 03-13-2020 1409     (approximate)  I have reviewed the triage vital signs and the nursing notes.   HISTORY  Chief Complaint Shortness of Breath    HPI Lisa Moon is a 87 y.o. female the below listed past medical history presents to the ER for evaluation of worsening shortness of breath generalized malaise.  States that she feels very swollen is having worsening orthopnea.  Found to be hypoxic requiring supplemental oxygen.  She states that she is not on home oxygen.  Denies any congestion.  No measured fevers.    Past Medical History:  Diagnosis Date  . A-fib (HCC)   . HLD (hyperlipidemia)   . Hypertension    Family History  Problem Relation Age of Onset  . Liver cancer Mother   . Heart attack Mother   . CAD Father    Past Surgical History:  Procedure Laterality Date  . ABDOMINAL HYSTERECTOMY    . CARDIAC ELECTROPHYSIOLOGY MAPPING AND ABLATION     Patient Active Problem List   Diagnosis Date Noted  . HTN (hypertension) 13-Mar-2020  . Abnormal LFTs 2020-03-13  . Sepsis (HCC) Mar 13, 2020  . Acute respiratory failure with hypoxia (HCC) Mar 13, 2020  . HCAP (healthcare-associated pneumonia) March 13, 2020  . Acute calculous cholecystitis 02/18/2020  . Severe sepsis with septic shock (HCC) 02/18/2020  . Atrial fibrillation with RVR (HCC) 02/18/2020  . Bradycardia 02/18/2020  . Hyponatremia 02/18/2020  . HLD (hyperlipidemia)   . Hypertension   . Pleural effusion   . Hyperkalemia   . AKI (acute kidney injury) (HCC)   . Elevated troponin   . Bilateral pleural effusion   . CAP (community acquired pneumonia) 07/30/2018      Prior to Admission medications   Medication Sig Start Date End Date Taking? Authorizing Provider  amiodarone (PACERONE) 200 MG tablet Take 1 tablet (200 mg total) by mouth 2 (two) times daily. 02/24/20  Yes Marrion Coy,  MD  apixaban (ELIQUIS) 5 MG TABS tablet Take 1 tablet (5 mg total) by mouth 2 (two) times daily. 02/24/20  Yes Marrion Coy, MD  calcium carbonate (OS-CAL) 1250 (500 Ca) MG chewable tablet Chew 1 tablet by mouth 2 (two) times daily.   Yes [provider]  clotrimazole (MYCELEX) 10 MG troche Take 10 mg by mouth 5 (five) times daily. 02/26/20  Yes [provider]  lovastatin (MEVACOR) 40 MG tablet Take 40 mg by mouth at bedtime. 05/25/18  Yes [provider]  Metoprolol Tartrate 75 MG TABS Take 150 mg by mouth 2 (two) times daily. 02/24/20  Yes Marrion Coy, MD  Multiple Vitamins-Minerals (MULTIVITAMIN ADULT PO) Take 1 tablet by mouth daily.   Yes [provider]  ondansetron (ZOFRAN-ODT) 4 MG disintegrating tablet Take 4 mg by mouth every 8 (eight) hours as needed for nausea. 02/16/20   [provider]    Allergies Flagyl [metronidazole], Penicillins, Sulfa antibiotics, and Verapamil    Social History Social History   Tobacco Use  . Smoking status: Never Smoker  . Smokeless tobacco: Never Used    Review of Systems Patient denies headaches, rhinorrhea, blurry vision, numbness, shortness of breath, chest pain, edema, cough, abdominal pain, nausea, vomiting, diarrhea, dysuria, fevers, rashes or hallucinations unless otherwise stated above in HPI. ____________________________________________   PHYSICAL EXAM:  VITAL SIGNS: Vitals:   03/13/2020 1500 2020/03/13 1530  BP: 124/89 125/86  Pulse: 100 94  Resp: (!) 29 (!) 26  Temp:    SpO2: 97% 100%    Constitutional: Alert and oriented.  Eyes: Conjunctivae are normal.  Head: Atraumatic. Nose: No congestion/rhinnorhea. Mouth/Throat: Mucous membranes are moist.   Neck: No stridor. Painless ROM.  Cardiovascular: Normal rate, regular rhythm. Grossly normal heart sounds.  Good peripheral circulation. Respiratory: tachypnea with inspiratory crackles Gastrointestinal: Soft and nontender. No  distention. No abdominal bruits. No CVA tenderness. Genitourinary:  Musculoskeletal: No lower extremity tenderness, 2+ BLE edema.  No joint effusions. Neurologic:  Normal speech and language. No gross focal neurologic deficits are appreciated. No facial droop Skin:  Skin is warm, dry and intact. No rash noted. Psychiatric: Mood and affect are normal. Speech and behavior are normal.  ____________________________________________   LABS (all labs ordered are listed, but only abnormal results are displayed)  Results for orders placed or performed during the hospital encounter of 03/08/20 (from the past 24 hour(s))  CBC with Differential     Status: Abnormal   Collection Time: Mar 08, 2020  2:05 PM  Result Value Ref Range   WBC 20.0 (H) 4.0 - 10.5 K/uL   RBC 5.38 (H) 3.87 - 5.11 MIL/uL   Hemoglobin 15.6 (H) 12.0 - 15.0 g/dL   HCT 28.4 13.2 - 44.0 %   MCV 84.4 80.0 - 100.0 fL   MCH 29.0 26.0 - 34.0 pg   MCHC 34.4 30.0 - 36.0 g/dL   RDW 10.2 (H) 72.5 - 36.6 %   Platelets 222 150 - 400 K/uL   nRBC 0.3 (H) 0.0 - 0.2 %   Neutrophils Relative % 86 %   Neutro Abs 17.3 (H) 1.7 - 7.7 K/uL   Lymphocytes Relative 7 %   Lymphs Abs 1.3 0.7 - 4.0 K/uL   Monocytes Relative 5 %   Monocytes Absolute 1.0 0.1 - 1.0 K/uL   Eosinophils Relative 0 %   Eosinophils Absolute 0.0 0.0 - 0.5 K/uL   Basophils Relative 0 %   Basophils Absolute 0.0 0.0 - 0.1 K/uL   Immature Granulocytes 2 %   Abs Immature Granulocytes 0.31 (H) 0.00 - 0.07 K/uL  Comprehensive metabolic panel     Status: Abnormal   Collection Time: 2020/03/08  2:05 PM  Result Value Ref Range   Sodium 115 (LL) 135 - 145 mmol/L   Potassium 6.3 (HH) 3.5 - 5.1 mmol/L   Chloride 86 (L) 98 - 111 mmol/L   CO2 20 (L) 22 - 32 mmol/L   Glucose, Bld 305 (H) 70 - 99 mg/dL   BUN 45 (H) 8 - 23 mg/dL   Creatinine, Ser 4.40 (H) 0.44 - 1.00 mg/dL   Calcium 8.6 (L) 8.9 - 10.3 mg/dL   Total Protein 6.4 (L) 6.5 - 8.1 g/dL   Albumin 3.2 (L) 3.5 - 5.0 g/dL   AST  65 (H) 15 - 41 U/L   ALT 147 (H) 0 - 44 U/L   Alkaline Phosphatase 391 (H) 38 - 126 U/L   Total Bilirubin 3.2 (H) 0.3 - 1.2 mg/dL   GFR, Estimated 48 (L) >60 mL/min   Anion gap 9 5 - 15  Brain natriuretic peptide     Status: Abnormal   Collection Time: 03/08/20  2:05 PM  Result Value Ref Range   B Natriuretic Peptide 1,428.0 (H) 0.0 - 100.0 pg/mL  Troponin I (High Sensitivity)     Status: None   Collection Time: March 08, 2020  2:05 PM  Result Value Ref Range   Troponin I (High Sensitivity) 17 <18  ng/L  Resp Panel by RT-PCR (Flu A&B, Covid) Nasopharyngeal Swab     Status: None   Collection Time: 03/18/20  2:05 PM   Specimen: Nasopharyngeal Swab; Nasopharyngeal(NP) swabs in vial transport medium  Result Value Ref Range   SARS Coronavirus 2 by RT PCR NEGATIVE NEGATIVE   Influenza A by PCR NEGATIVE NEGATIVE   Influenza B by PCR NEGATIVE NEGATIVE   ____________________________________________  EKG My review and personal interpretation at Time: 13:52   Indication: sob  Rate: 75  Rhythm: junctinoal Axis: normal Other: abnormal ekg, no hyperacute t waves ____________________________________________  RADIOLOGY  I personally reviewed all radiographic images ordered to evaluate for the above acute complaints and reviewed radiology reports and findings.  These findings were personally discussed with the patient.  Please see medical record for radiology report.  ____________________________________________   PROCEDURES  Procedure(s) performed:  .Critical Care Performed by: Merlyn Lot, MD Authorized by: Merlyn Lot, MD   Critical care provider statement:    Critical care time (minutes):  40   Critical care time was exclusive of:  Separately billable procedures and treating other patients   Critical care was necessary to treat or prevent imminent or life-threatening deterioration of the following conditions:  Respiratory failure and metabolic crisis   Critical care was time  spent personally by me on the following activities:  Development of treatment plan with patient or surrogate, discussions with consultants, evaluation of patient's response to treatment, examination of patient, obtaining history from patient or surrogate, ordering and performing treatments and interventions, ordering and review of laboratory studies, ordering and review of radiographic studies, pulse oximetry, re-evaluation of patient's condition and review of old charts      Critical Care performed: yes ____________________________________________   INITIAL IMPRESSION / [place] / ED COURSE  Pertinent labs & imaging results that were available during my care of the patient were reviewed by me and considered in my medical decision making (see chart for details).   DDX: Asthma, copd, CHF, pna, ptx, malignancy, Pe, anemia   Robinette Esters is a 87 y.o. who presents to the ED with presentation as described above.  Patient has significant edematous ulcer with no O2 requirement recent hospitalization complicating her presentation recently admitted for sepsis.  Chest x-ray shows diffuse edema as well as cardiomegaly also concerning for pneumonia.  Significant white count of 20,000.  She is currently afebrile.  Have added on lactic acid.  Will hold off on IV fluid resuscitation as she is normotensive currently and this may be more indicative of CHF but will obtain cultures and give dose of antibiotics as she is at high risk for sepsis.  Clinical Course as of 03/18/2020 9798  03-18-20  1517 Patients blood work with evidence of hyponatremia hypochloremia hyperglycemia, hyperkalemia.  I am going to repeat blood work to confirm this is not abnormal report or lab error.  We will continue with plan for antibiotics. Appears anasarca.  Will consult with intensivist.  . [PR]  1553 Is a patient does appear anasarca case was discussed with nephrology who did recommend adding Veltassa right now.   Will plan initiating Lasix drip pending repeat BMP confirms the initial.  Will avoid hypertonic saline so as not to worsen findings of heart failure.   [PR]    Clinical Course User Index [PR] Merlyn Lot, MD    The patient was evaluated in Emergency Department today for the symptoms described in the history of present illness. He/she was evaluated  in the context of the global COVID-19 pandemic, which necessitated consideration that the patient might be at risk for infection with the SARS-CoV-2 virus that causes COVID-19. Institutional protocols and algorithms that pertain to the evaluation of patients at risk for COVID-19 are in a state of rapid change based on information released by regulatory bodies including the CDC and federal and state organizations. These policies and algorithms were followed during the patient's care in the ED.  As part of my medical decision making, I reviewed the following data within the electronic MEDICAL RECORD NUMBER Nursing notes reviewed and incorporated, Labs reviewed, notes from prior ED visits and [place] Controlled Substance Database   ____________________________________________   FINAL CLINICAL IMPRESSION(S) / ED DIAGNOSES  Final diagnoses:  Acute respiratory failure with hypoxia (HCC)  Hyponatremia      NEW MEDICATIONS STARTED DURING THIS VISIT:  New Prescriptions   No medications on file     Note:  This document was prepared using Dragon voice recognition software and may include unintentional dictation errors.    Willy Eddy, MD March 26, 2020 [phone]

## 2020-04-02 NOTE — ED Notes (Signed)
Per Dr. Marcos Eke pt is a DNR at this time per daughter.

## 2020-04-02 NOTE — ED Notes (Signed)
Pulse check, pulse palpated to carotid. CPR stopped at this time.

## 2020-04-02 NOTE — ED Notes (Signed)
Date and time results received: Mar 05, 2020 1504   Test: Sodium  Critical Value: 115  Name of Provider Notified: Roxan Hockey, MD   Orders Received? Or Actions Taken?: ED MD aware

## 2020-04-02 NOTE — ED Notes (Signed)
Date and time results received: 03/08/2020 1604   Test: Sodium Critical Value: 114  Name of Provider Notified: Roxan Hockey, MD  Orders Received? Or Actions Taken?: ED MD aware

## 2020-04-02 NOTE — ED Notes (Signed)
VTach on the monitor.

## 2020-04-02 NOTE — ED Notes (Signed)
Pt with noted EKG changes, pt noted to brady down to 52, VORB for 1mg  Epi IV push

## 2020-04-02 NOTE — ED Notes (Addendum)
Regular Insulin 10 units IV given per VO from Marcos Eke, MD at this time. Calcium 1 amp IV given at this time per Viaan Knippenberg Camp, MD.

## 2020-04-02 NOTE — ED Notes (Signed)
Attending Marcos Eke contacted to come see patient regarding change in status. BP dropping, states nauseated. Attempting to gain another line with [place] at this time.

## 2020-04-02 NOTE — ED Notes (Signed)
Pulse check, no pulse CPR continues

## 2020-04-02 NOTE — ED Notes (Signed)
Pt bagged by Dr. Jayme Cloud at this time, RT notified per Dr. Jayme Cloud going to intubate patient.

## 2020-04-02 NOTE — ED Notes (Signed)
No pulse noted, providers at bedside. Time of death called at this time.

## 2020-04-02 DEATH — deceased

## 2022-10-28 IMAGING — DX DG CHEST 1V PORT
1 series · 1 of 1 positions shown · non-contrast
Comparison: July 30, 2018.

CLINICAL DATA: Chest pain.  Tachycardia.

EXAM:
PORTABLE CHEST 1 VIEW

[chest ap]
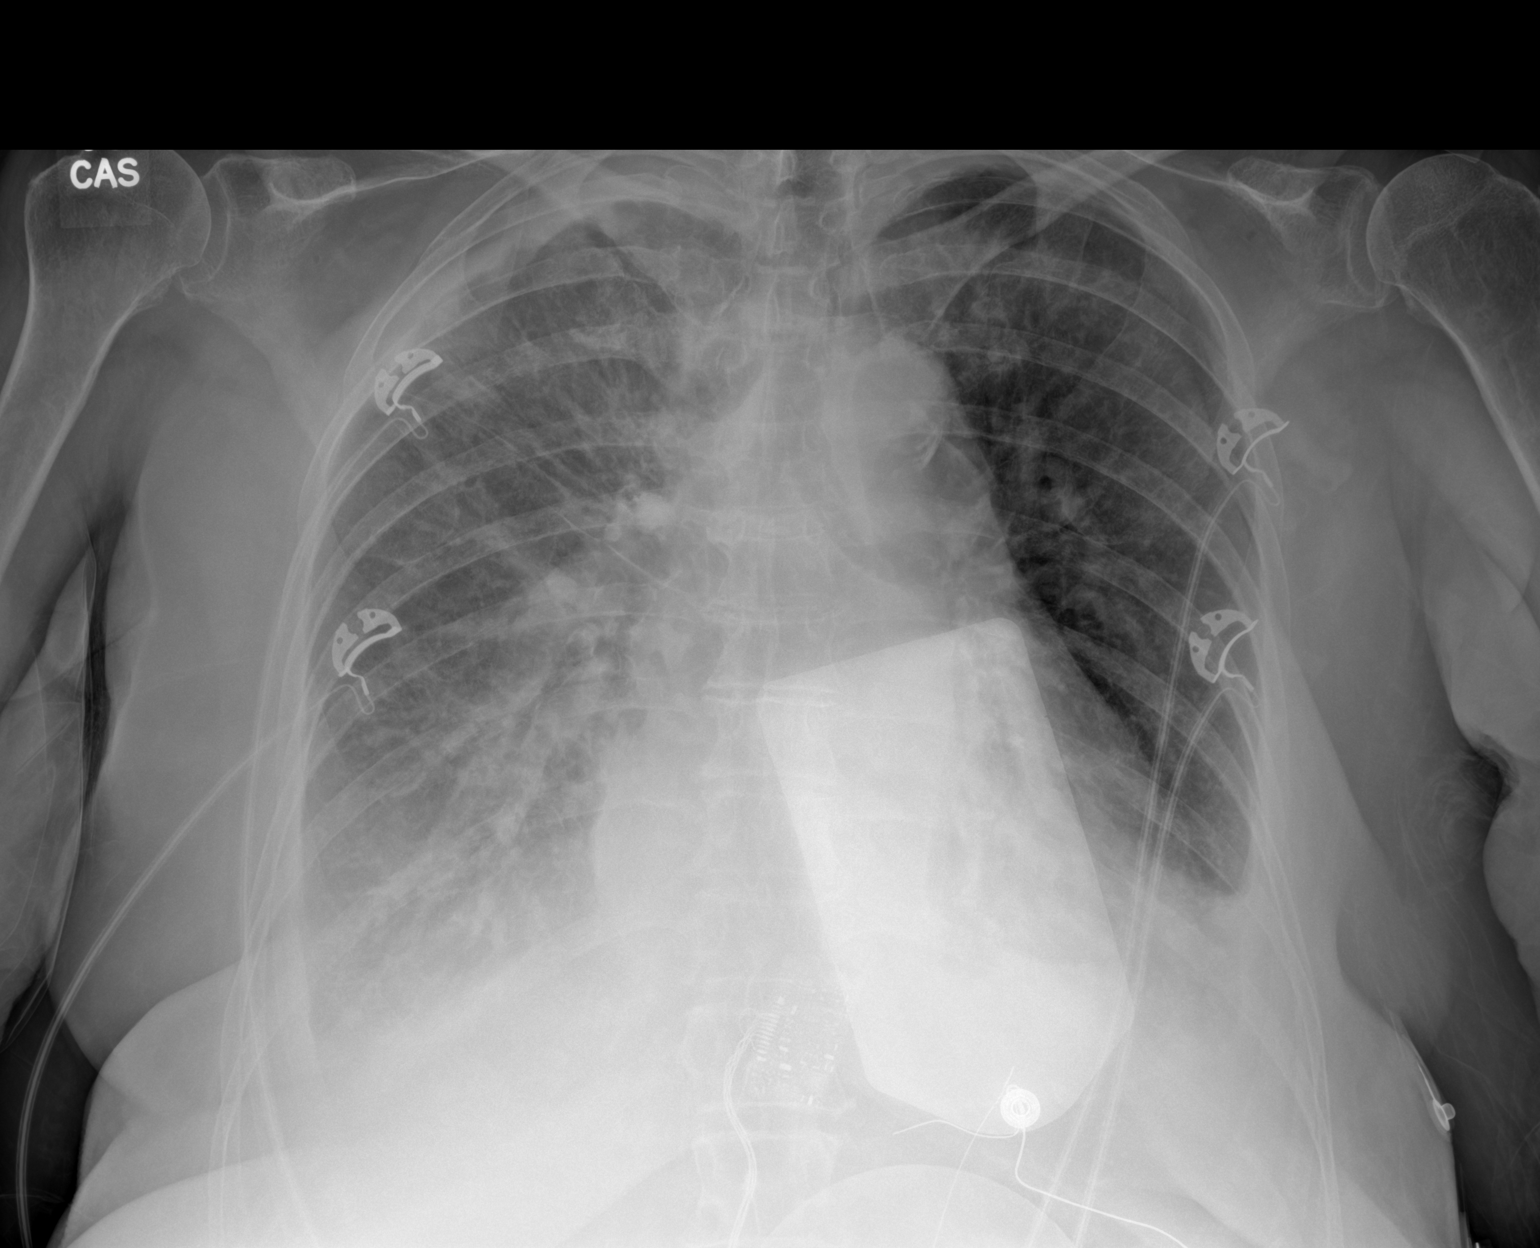

[1 of 1 positions shown; findings below may reference images not displayed]

FINDINGS: Layering right greater than left pleural effusions. Hazy opacity the
right lung, favored to reflect layering fluid. Additional more focal
opacities at the left lung base. Mild diffuse interstitial
prominence. No visible pneumothorax on this limited portable supine
radiograph. Biapical pleuroparenchymal scarring. Enlarged cardiac
silhouette. Aortic atherosclerosis. No acute osseous abnormality
IMPRESSION: 1. Layering right greater than left pleural effusions. Mild
interstitial prominence, suggestive of mild interstitial edema.
2. Left basilar opacities may represent atelectasis, aspiration,
and/or pneumonia.
3. Cardiomegaly.

## 2022-10-29 IMAGING — US US THORACENTESIS ASP PLEURAL SPACE W/IMG GUIDE
1 series · 5 of 5 positions shown · non-contrast
Comparison: none

INDICATION: 87-year-old with pleural effusions.

[Series 1: us thoracentesis asp pleural s · 5 of 5 slices shown]
[im 1/5]
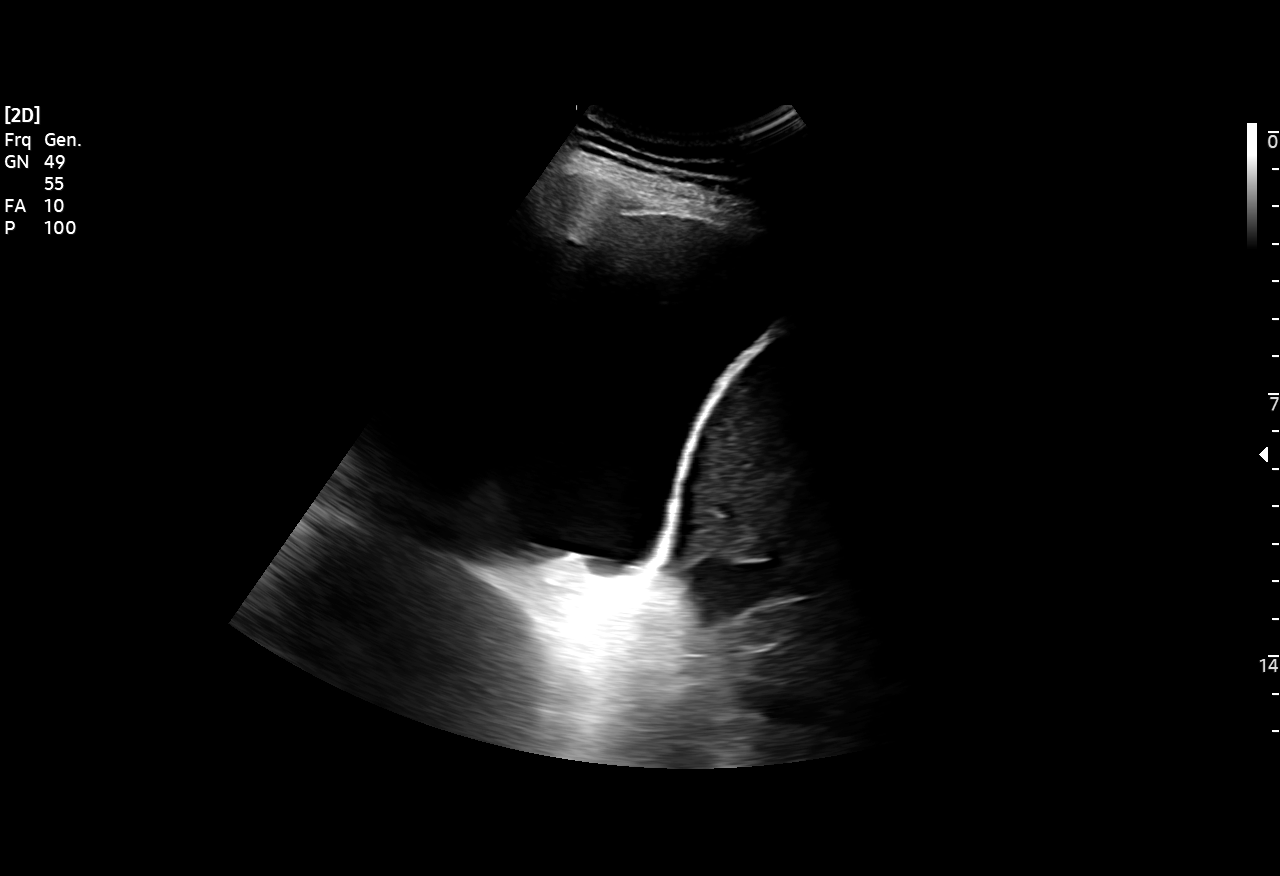
[im 2/5]
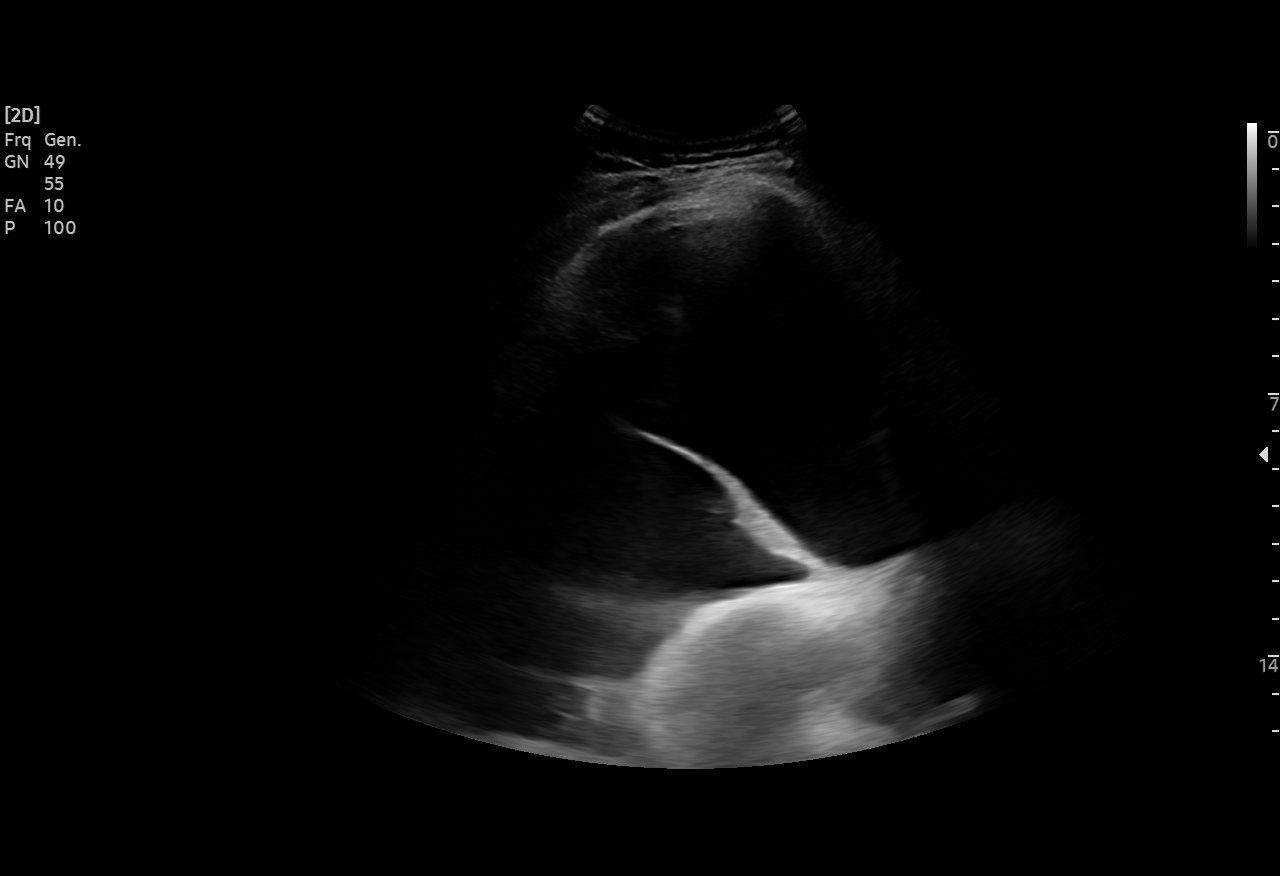
[im 3/5]
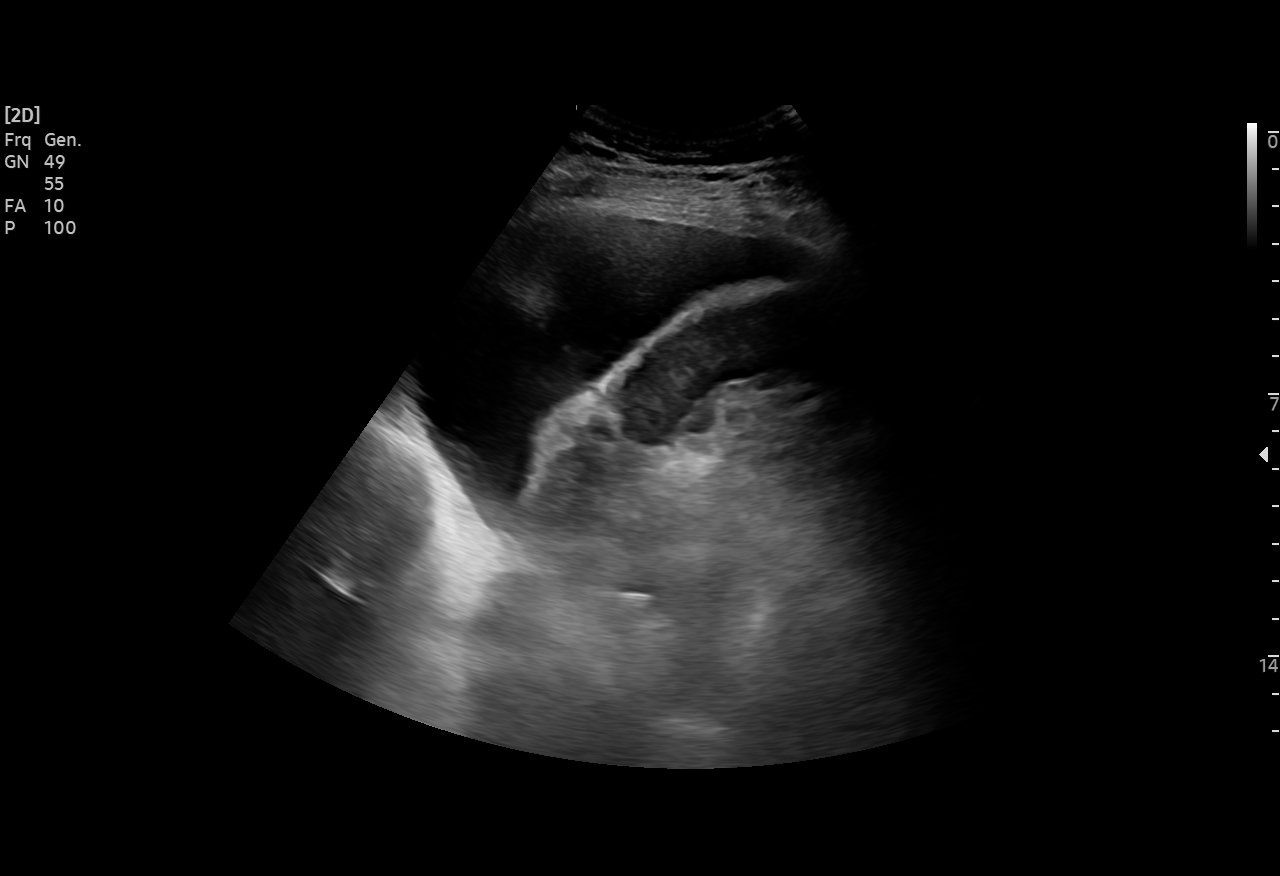
[im 4/5]
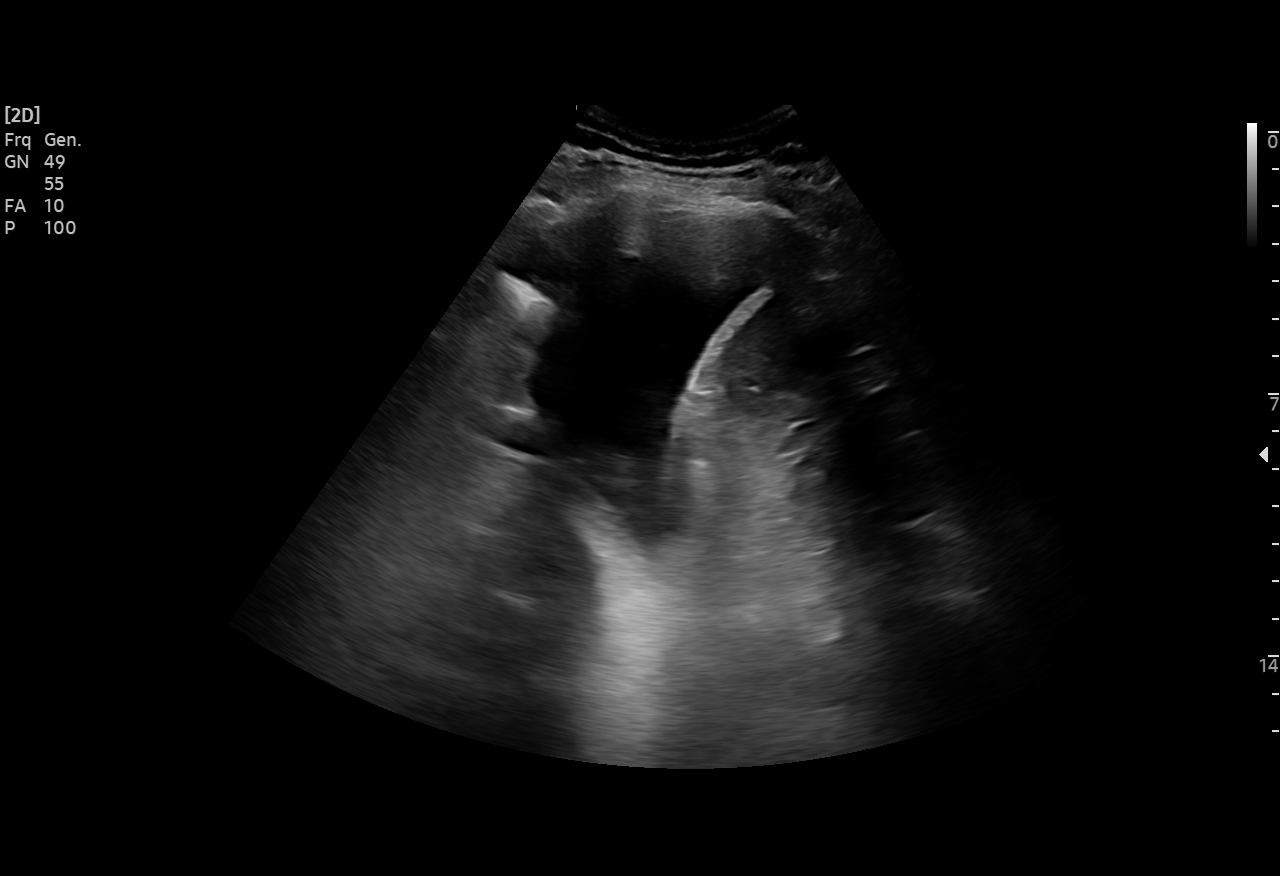
[im 5/5]
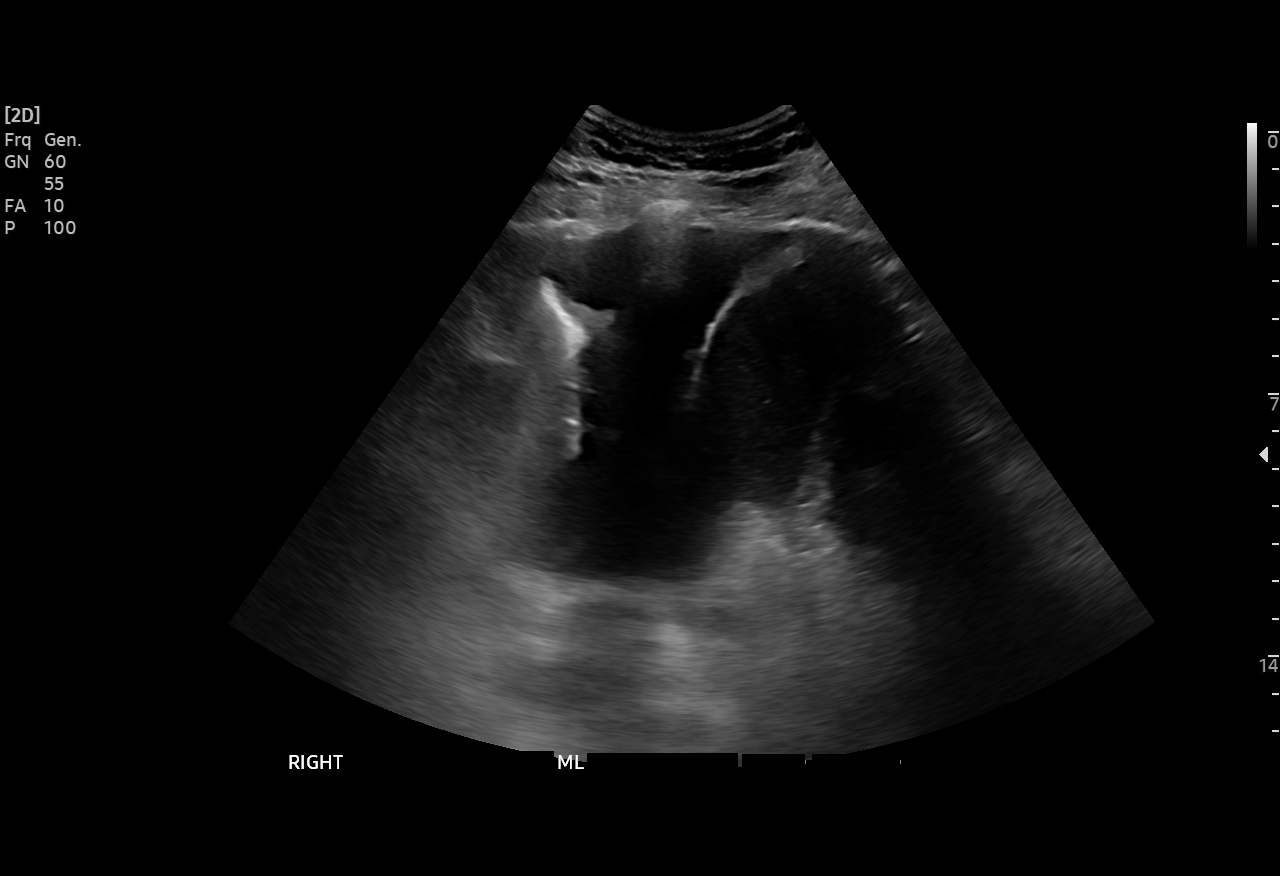

[5 of 5 positions shown; findings below may reference images not displayed]

EXAM:
ULTRASOUND GUIDED RIGHT THORACENTESIS

MEDICATIONS:
None.

COMPLICATIONS:
None immediate.

PROCEDURE:
An ultrasound guided thoracentesis was thoroughly discussed with the
patient and questions answered. The benefits, risks, alternatives
and complications were also discussed. The patient understands and
wishes to proceed with the procedure. Written consent was obtained.

Ultrasound was performed to localize and mark an adequate pocket of
fluid in the right chest. The area was then prepped and draped in
the normal sterile fashion. 1% Lidocaine was used for local
anesthesia. Under ultrasound guidance a 6 Fr Safe-T-Centesis
catheter was introduced. Thoracentesis was performed. The catheter
was removed and a dressing applied.
FINDINGS: A total of approximately 1 L of yellow fluid was removed. Samples
were sent to the laboratory as requested by the clinical team.

Negative for pneumothorax on the follow-up chest radiograph.
IMPRESSION: Successful ultrasound guided right thoracentesis yielding 1 L of
pleural fluid.

## 2022-10-29 IMAGING — DX DG CHEST 1V PORT
1 series · 1 of 1 positions shown · non-contrast
Comparison: 02/18/2020

CLINICAL DATA: US guided thoracentesis Findings: Removed 9777 ml
from right chest.

EXAM:
PORTABLE CHEST - 1 VIEW

[chest ap]
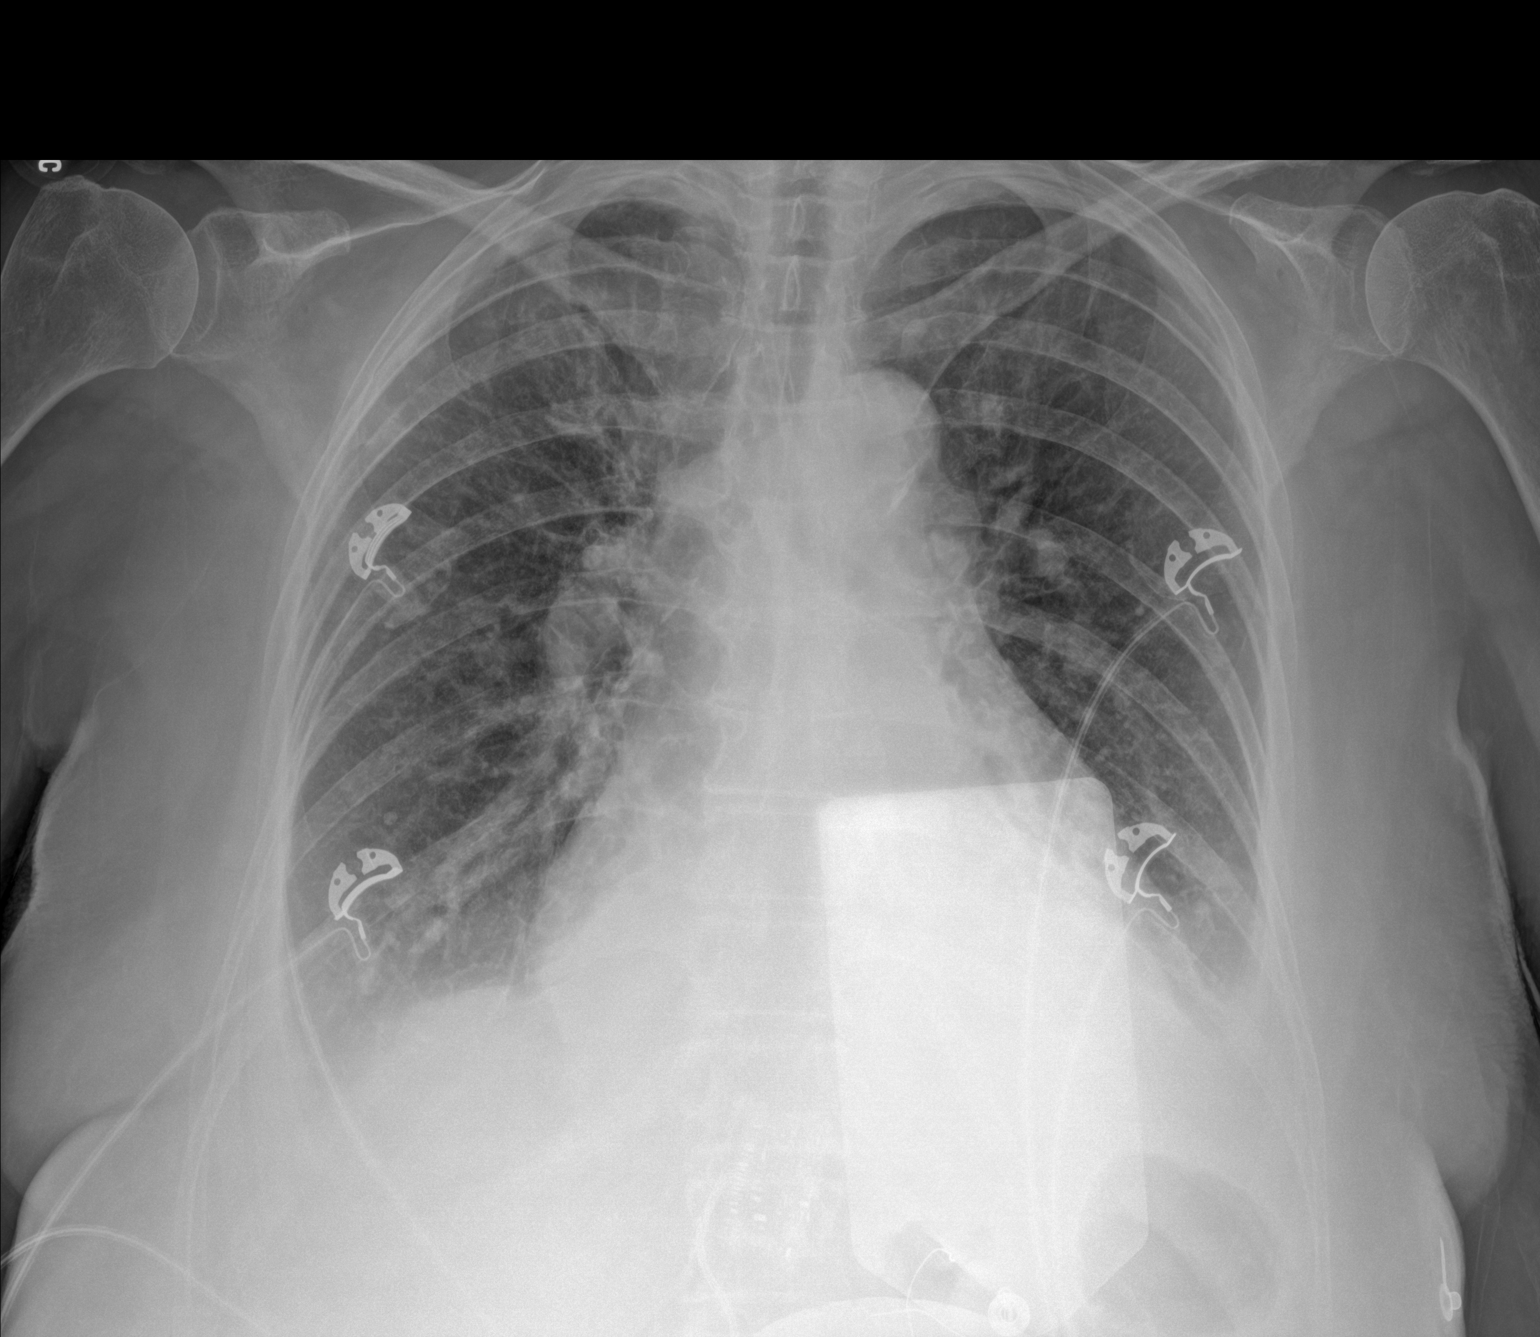

[1 of 1 positions shown; findings below may reference images not displayed]

FINDINGS: No pneumothorax. Persistent bilateral pleural effusions.
Atelectasis/consolidation in the lung bases left greater than right.
Perihilar interstitial opacities right greater than left as before.

Heart size upper limits normal for technique. Aortic Atherosclerosis
([id number]).

Visualized bones unremarkable.
IMPRESSION: No pneumothorax post thoracentesis.
